# Patient Record
Sex: Female | Born: 1965 | Race: Black or African American | Hispanic: No | Marital: Single | Smoking: Never smoker
Health system: Southern US, Community
[De-identification: ages and names within clinical notes are randomized; demographics above are authoritative.]

## PROBLEM LIST (undated history)

## (undated) DIAGNOSIS — K922 Gastrointestinal hemorrhage, unspecified: Secondary | ICD-10-CM

## (undated) DIAGNOSIS — E7800 Pure hypercholesterolemia, unspecified: Secondary | ICD-10-CM

## (undated) HISTORY — PX: TUBAL LIGATION: SHX77

---

## 1997-12-20 ENCOUNTER — Emergency Department (HOSPITAL_COMMUNITY): Admission: EM | Admit: 1997-12-20 | Discharge: 1997-12-20 | Payer: Self-pay | Admitting: Emergency Medicine

## 1998-04-17 ENCOUNTER — Other Ambulatory Visit: Admission: RE | Admit: 1998-04-17 | Discharge: 1998-04-17 | Payer: Self-pay | Admitting: Obstetrics and Gynecology

## 1999-05-26 ENCOUNTER — Other Ambulatory Visit: Admission: RE | Admit: 1999-05-26 | Discharge: 1999-05-26 | Payer: Self-pay | Admitting: Obstetrics and Gynecology

## 2000-06-23 ENCOUNTER — Other Ambulatory Visit: Admission: RE | Admit: 2000-06-23 | Discharge: 2000-06-23 | Payer: Self-pay | Admitting: Obstetrics and Gynecology

## 2001-07-17 ENCOUNTER — Other Ambulatory Visit: Admission: RE | Admit: 2001-07-17 | Discharge: 2001-07-17 | Payer: Self-pay | Admitting: Obstetrics and Gynecology

## 2001-10-16 ENCOUNTER — Encounter: Admission: RE | Admit: 2001-10-16 | Discharge: 2001-10-16 | Payer: Self-pay | Admitting: Family Medicine

## 2001-10-16 ENCOUNTER — Encounter: Payer: Self-pay | Admitting: Family Medicine

## 2002-07-20 ENCOUNTER — Other Ambulatory Visit: Admission: RE | Admit: 2002-07-20 | Discharge: 2002-07-20 | Payer: Self-pay | Admitting: Obstetrics and Gynecology

## 2003-07-22 ENCOUNTER — Other Ambulatory Visit: Admission: RE | Admit: 2003-07-22 | Discharge: 2003-07-22 | Payer: Self-pay | Admitting: Obstetrics and Gynecology

## 2006-11-04 ENCOUNTER — Encounter: Admission: RE | Admit: 2006-11-04 | Discharge: 2006-11-04 | Payer: Self-pay | Admitting: Obstetrics and Gynecology

## 2007-06-22 ENCOUNTER — Ambulatory Visit (HOSPITAL_COMMUNITY): Admission: RE | Admit: 2007-06-22 | Discharge: 2007-06-22 | Payer: Self-pay | Admitting: Obstetrics and Gynecology

## 2007-07-06 HISTORY — PX: BUNIONECTOMY: SHX129

## 2007-11-13 ENCOUNTER — Encounter: Payer: Self-pay | Admitting: Internal Medicine

## 2007-12-12 ENCOUNTER — Encounter: Admission: RE | Admit: 2007-12-12 | Discharge: 2007-12-12 | Payer: Self-pay | Admitting: Obstetrics and Gynecology

## 2007-12-12 ENCOUNTER — Encounter: Admission: RE | Admit: 2007-12-12 | Discharge: 2007-12-12 | Payer: Self-pay | Admitting: Family Medicine

## 2008-04-07 ENCOUNTER — Emergency Department (HOSPITAL_COMMUNITY): Admission: EM | Admit: 2008-04-07 | Discharge: 2008-04-07 | Payer: Self-pay | Admitting: Emergency Medicine

## 2008-10-03 LAB — CONVERTED CEMR LAB: Pap Smear: NORMAL

## 2008-11-21 ENCOUNTER — Ambulatory Visit: Payer: Self-pay | Admitting: Internal Medicine

## 2008-11-21 DIAGNOSIS — G4700 Insomnia, unspecified: Secondary | ICD-10-CM | POA: Insufficient documentation

## 2008-11-21 DIAGNOSIS — E785 Hyperlipidemia, unspecified: Secondary | ICD-10-CM | POA: Insufficient documentation

## 2008-12-19 ENCOUNTER — Encounter: Admission: RE | Admit: 2008-12-19 | Discharge: 2008-12-19 | Payer: Self-pay | Admitting: Obstetrics and Gynecology

## 2009-09-26 ENCOUNTER — Ambulatory Visit (HOSPITAL_BASED_OUTPATIENT_CLINIC_OR_DEPARTMENT_OTHER): Admission: RE | Admit: 2009-09-26 | Discharge: 2009-09-26 | Payer: Self-pay | Admitting: Specialist

## 2009-12-06 ENCOUNTER — Emergency Department (HOSPITAL_COMMUNITY): Admission: EM | Admit: 2009-12-06 | Discharge: 2009-12-07 | Payer: Self-pay | Admitting: Emergency Medicine

## 2010-01-19 ENCOUNTER — Encounter: Admission: RE | Admit: 2010-01-19 | Discharge: 2010-01-19 | Payer: Self-pay | Admitting: Obstetrics and Gynecology

## 2010-07-26 ENCOUNTER — Encounter: Payer: Self-pay | Admitting: Obstetrics and Gynecology

## 2010-09-21 LAB — COMPREHENSIVE METABOLIC PANEL
ALT: 18 U/L (ref 0–35)
AST: 26 U/L (ref 0–37)
Albumin: 3.6 g/dL (ref 3.5–5.2)
Alkaline Phosphatase: 54 U/L (ref 39–117)
BUN: 9 mg/dL (ref 6–23)
CO2: 26 mEq/L (ref 19–32)
Calcium: 8.8 mg/dL (ref 8.4–10.5)
Chloride: 106 mEq/L (ref 96–112)
Creatinine, Ser: 0.69 mg/dL (ref 0.4–1.2)
GFR calc Af Amer: >60 mL/min (ref >60)
GFR calc non Af Amer: >60 mL/min (ref >60)
Glucose, Bld: 104 mg/dL — ABNORMAL HIGH (ref 70–99)
Potassium: 2.9 mEq/L — ABNORMAL LOW (ref 3.5–5.1)
Sodium: 136 mEq/L (ref 135–145)
Total Bilirubin: 0.4 mg/dL (ref 0.3–1.2)
Total Protein: 6.3 g/dL (ref 6.0–8.3)

## 2010-09-21 LAB — DIFFERENTIAL
Basophils Absolute: 0 10*3/uL (ref 0.0–0.1)
Basophils Relative: 0 % (ref 0–1)
Eosinophils Absolute: 0.1 10*3/uL (ref 0.0–0.7)
Eosinophils Relative: 0 % (ref 0–5)
Lymphocytes Relative: 10 % — ABNORMAL LOW (ref 12–46)
Lymphs Abs: 1.6 10*3/uL (ref 0.7–4.0)
Monocytes Absolute: 0.7 10*3/uL (ref 0.1–1.0)
Monocytes Relative: 4 % (ref 3–12)
Neutro Abs: 13.7 10*3/uL — ABNORMAL HIGH (ref 1.7–7.7)
Neutrophils Relative %: 85 % — ABNORMAL HIGH (ref 43–77)

## 2010-09-21 LAB — PROTIME-INR
INR: 1.02 (ref 0.00–1.49)
Prothrombin Time: 13.3 seconds (ref 11.6–15.2)

## 2010-09-21 LAB — URINALYSIS, ROUTINE W REFLEX MICROSCOPIC
Bilirubin Urine: NEGATIVE
Glucose, UA: NEGATIVE mg/dL
Hgb urine dipstick: NEGATIVE
Ketones, ur: NEGATIVE mg/dL
Nitrite: NEGATIVE
Protein, ur: NEGATIVE mg/dL
Specific Gravity, Urine: 1.02 (ref 1.005–1.030)
Urobilinogen, UA: 0.2 mg/dL (ref 0.0–1.0)
pH: 5.5 (ref 5.0–8.0)

## 2010-09-21 LAB — POCT CARDIAC MARKERS
CKMB, poc: <1 ng/mL — ABNORMAL LOW (ref 1.0–8.0)
CKMB, poc: <1 ng/mL — ABNORMAL LOW (ref 1.0–8.0)
Myoglobin, poc: 31.8 ng/mL (ref 12–200)
Myoglobin, poc: 32.1 ng/mL (ref 12–200)
Troponin i, poc: <0.05 ng/mL (ref 0.00–0.09)
Troponin i, poc: <0.05 ng/mL (ref 0.00–0.09)

## 2010-09-21 LAB — CBC
HCT: 34.4 % — ABNORMAL LOW (ref 36.0–46.0)
Hemoglobin: 11.2 g/dL — ABNORMAL LOW (ref 12.0–15.0)
MCHC: 32.7 g/dL (ref 30.0–36.0)
MCV: 83.4 fL (ref 78.0–100.0)
Platelets: 234 10*3/uL (ref 150–400)
RBC: 4.12 MIL/uL (ref 3.87–5.11)
RDW: 13.4 % (ref 11.5–15.5)
WBC: 16.1 10*3/uL — ABNORMAL HIGH (ref 4.0–10.5)

## 2010-09-21 LAB — LIPASE, BLOOD: Lipase: 32 U/L (ref 11–59)

## 2010-09-21 LAB — POCT PREGNANCY, URINE: Preg Test, Ur: NEGATIVE

## 2010-09-28 LAB — POCT HEMOGLOBIN-HEMACUE: Hemoglobin: 13.8 g/dL (ref 12.0–15.0)

## 2010-09-28 LAB — SYNOVIAL CELL COUNT + DIFF, W/ CRYSTALS
Crystals, Fluid: NONE SEEN
Eosinophils-Synovial: 2 % — ABNORMAL HIGH (ref 0–1)
Lymphocytes-Synovial Fld: 59 % — ABNORMAL HIGH (ref 0–20)
Monocyte-Macrophage-Synovial Fluid: 22 % — ABNORMAL LOW (ref 50–90)
Neutrophil, Synovial: 17 % (ref 0–25)
Other Cells-SYN: 0
WBC, Synovial: UNDETERMINED /mm3 (ref 0–200)

## 2010-11-17 NOTE — Op Note (Signed)
NAMELATORI, BEGGS               ACCOUNT NO.:  [id number]   MEDICAL RECORD NO.:  [id number]          PATIENT TYPE:  AMB   LOCATION:  SDC                           FACILITY:  [facility]   PHYSICIAN:  Lenoard Aden, M.D.DATE OF BIRTH:  1965-12-15   DATE OF PROCEDURE:  06/22/2007  DATE OF DISCHARGE:                               OPERATIVE REPORT   CHIEF COMPLAINT:  Desire for elective sterilization.   PREOPERATIVE DIAGNOSIS:  Desire for elective sterilization.   POSTOPERATIVE DIAGNOSIS:  Desire for elective sterilization.   PROCEDURE:  Diagnostic laparoscopy, laparoscopic tubal ligation.   SURGEON:  Lenoard Aden, M.D.   ANESTHESIA:  General.   ESTIMATED BLOOD LOSS:  Less than 50 mL.   COMPLICATIONS:  None.   DRAINS:  None.   COUNTS:  Correct.   CONDITION:  The patient recovered in good condition.   Brief.   OPERATIVE NOTE:  After being apprised of the risks of anesthesia,  infection, bleeding, injury to abdominal organs, need for repair as well  as complications to include bowel or bladder injury the patient was  brought to the operating room where she was administered general  anesthetic without complications, prepped and draped in usual sterile  fashion.  Hulka tenaculum placed per vagina.  Infraumbilical incision  made with a scalpel.  Veress needle placed.  Opening pressure -1 noted  after negative saline test.  Four liters CO2 insufflated without  difficulty.  Trocar placed atraumatically.  Pictures taken.  Normal  liver, gallbladder bed, normal appendiceal area.  Normal tubes, normal  ovaries.  Uterus has a small approximately 2 cm subserosal posterior  wall fibroid.  Otherwise normal findings.  The right tube was traced out  to the fimbriated end.  The Kleppinger bipolar electrocautery is placed  in the ampullary isthmic portion of the tube, cauterizing to a  resistance of zero, three contiguous portions of the tube.  Same  procedure that was done on the right  tube was also done on the left  tube.  Both tubes were then addressed with the hook scissors and divided  in the midportion of the cauterized area whereby the tubal lumens were  visualized.  CO2 was released.  Good hemostasis was noted.  No active  bleeding is appreciated.  Instruments were removed  under direct visualization.  CO2 was rereleased.  Positive pressure  applied.  Incision closed using zero Vicryl and Dermabond.  Instruments  removed from the vagina.  The patient tolerated the procedure well and  was transferred to surgical recovery in good condition.      Lenoard Aden, M.D.  Electronically Signed     RJT/MEDQ  D:  06/22/2007  T:  06/23/2007  Job:  [id number]

## 2011-01-18 ENCOUNTER — Other Ambulatory Visit: Payer: Self-pay | Admitting: Obstetrics and Gynecology

## 2011-01-18 DIAGNOSIS — Z1231 Encounter for screening mammogram for malignant neoplasm of breast: Secondary | ICD-10-CM

## 2011-01-29 ENCOUNTER — Ambulatory Visit
Admission: RE | Admit: 2011-01-29 | Discharge: 2011-01-29 | Disposition: A | Discharge status: Home / Self Care | Payer: BC Managed Care – PPO | Source: Ambulatory Visit | Attending: Obstetrics and Gynecology | Admitting: Obstetrics and Gynecology

## 2011-01-29 DIAGNOSIS — Z1231 Encounter for screening mammogram for malignant neoplasm of breast: Secondary | ICD-10-CM

## 2011-04-09 LAB — CBC
HCT: 39.2
Hemoglobin: 13.1
MCHC: 33.3
MCV: 83.3
Platelets: 294
RBC: 4.7
RDW: 13.7
WBC: 8.7

## 2011-04-09 LAB — HCG, SERUM, QUALITATIVE: Preg, Serum: NEGATIVE

## 2011-07-14 ENCOUNTER — Ambulatory Visit: Payer: No Typology Code available for payment source | Attending: Orthopedic Surgery | Admitting: Physical Therapy

## 2011-07-14 DIAGNOSIS — M542 Cervicalgia: Secondary | ICD-10-CM | POA: Insufficient documentation

## 2011-07-14 DIAGNOSIS — IMO0001 Reserved for inherently not codable concepts without codable children: Secondary | ICD-10-CM | POA: Insufficient documentation

## 2011-07-15 ENCOUNTER — Ambulatory Visit: Payer: No Typology Code available for payment source | Admitting: Physical Therapy

## 2011-07-19 ENCOUNTER — Ambulatory Visit: Payer: No Typology Code available for payment source | Admitting: Physical Therapy

## 2011-07-20 ENCOUNTER — Ambulatory Visit: Payer: No Typology Code available for payment source | Admitting: Physical Therapy

## 2011-07-28 ENCOUNTER — Ambulatory Visit: Payer: No Typology Code available for payment source | Admitting: Physical Therapy

## 2011-07-29 ENCOUNTER — Ambulatory Visit: Payer: No Typology Code available for payment source | Admitting: Physical Therapy

## 2011-08-04 ENCOUNTER — Ambulatory Visit: Payer: No Typology Code available for payment source | Admitting: Physical Therapy

## 2011-08-05 ENCOUNTER — Ambulatory Visit: Payer: No Typology Code available for payment source | Admitting: Physical Therapy

## 2011-08-06 ENCOUNTER — Ambulatory Visit: Payer: No Typology Code available for payment source | Attending: Orthopedic Surgery | Admitting: Physical Therapy

## 2011-08-06 DIAGNOSIS — IMO0001 Reserved for inherently not codable concepts without codable children: Secondary | ICD-10-CM | POA: Insufficient documentation

## 2011-08-06 DIAGNOSIS — M542 Cervicalgia: Secondary | ICD-10-CM | POA: Insufficient documentation

## 2011-08-09 ENCOUNTER — Ambulatory Visit: Payer: No Typology Code available for payment source | Admitting: Physical Therapy

## 2011-08-12 ENCOUNTER — Ambulatory Visit: Payer: No Typology Code available for payment source | Admitting: Physical Therapy

## 2011-08-17 ENCOUNTER — Ambulatory Visit: Payer: No Typology Code available for payment source | Admitting: Physical Therapy

## 2011-08-19 ENCOUNTER — Ambulatory Visit: Payer: No Typology Code available for payment source | Admitting: Physical Therapy

## 2012-02-10 ENCOUNTER — Other Ambulatory Visit: Payer: Self-pay | Admitting: Obstetrics and Gynecology

## 2012-02-10 DIAGNOSIS — Z1231 Encounter for screening mammogram for malignant neoplasm of breast: Secondary | ICD-10-CM

## 2012-02-21 ENCOUNTER — Ambulatory Visit: Payer: BC Managed Care – PPO

## 2012-02-23 ENCOUNTER — Ambulatory Visit
Admission: RE | Admit: 2012-02-23 | Discharge: 2012-02-23 | Disposition: A | Discharge status: Home / Self Care | Payer: 59 | Source: Ambulatory Visit | Attending: Obstetrics and Gynecology | Admitting: Obstetrics and Gynecology

## 2012-02-23 DIAGNOSIS — Z1231 Encounter for screening mammogram for malignant neoplasm of breast: Secondary | ICD-10-CM

## 2012-07-11 ENCOUNTER — Encounter (HOSPITAL_COMMUNITY): Payer: Self-pay | Admitting: *Deleted

## 2012-07-11 ENCOUNTER — Emergency Department (INDEPENDENT_AMBULATORY_CARE_PROVIDER_SITE_OTHER)
Admission: EM | Admit: 2012-07-11 | Discharge: 2012-07-11 | Disposition: A | Discharge status: Home / Self Care | Payer: Worker's Compensation | Source: Home / Self Care

## 2012-07-11 DIAGNOSIS — IMO0002 Reserved for concepts with insufficient information to code with codable children: Secondary | ICD-10-CM

## 2012-07-11 DIAGNOSIS — S9030XA Contusion of unspecified foot, initial encounter: Secondary | ICD-10-CM

## 2012-07-11 DIAGNOSIS — T148XXA Other injury of unspecified body region, initial encounter: Secondary | ICD-10-CM

## 2012-07-11 NOTE — ED Notes (Signed)
Was going through a turnstyle when she was returning to work @ 2130 and cut L heel.  She said it was swollen last night and applied ice.  1" laceration to heel.

## 2012-07-11 NOTE — ED Notes (Signed)
Paper that was faxed from her work did not need to be filled in.  [name] said , " She will not have to have further treatment."

## 2012-07-11 NOTE — ED Notes (Addendum)
No ace wrap was actually used.  Cathy Rasmussen NP wants a coban dressing applied like an ace for support without being to thick like an ace. Pt. Requested work note.  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 Pt. said she can work if she can get a shoe on.  She does not have to go in until 1700.  Work note done to return tomorrow.

## 2012-07-11 NOTE — ED Provider Notes (Signed)
History     CSN: [id number]  Arrival date & time 07/11/12  1818   None     Chief Complaint  Patient presents with  . Extremity Laceration    (Consider location/radiation/quality/duration/timing/severity/associated sxs/prior treatment) HPI Comments: 48 year old female was walking through a turnstile last night and one of the metal bars struck her in the left posterior heel. She received a superficial linear abrasion as well is contusion of the Achilles tendon. She is complaining of mild pain over the tendon especially with walking and plantar flexion. She has been applying ice and wearing an Ace-type wrap.   No past medical history on file.  No past surgical history on file.  No family history on file.  History  Substance Use Topics  . Smoking status: Not on file  . Smokeless tobacco: Not on file  . Alcohol Use: Not on file    OB History    No data available      Review of Systems  Constitutional: Negative for fever, chills and activity change.  HENT: Negative.   Respiratory: Negative.   Cardiovascular: Negative.   Musculoskeletal:       As per HPI  Skin: Negative for color change, pallor and rash.  Neurological: Negative.     Allergies  Codeine  Home Medications   Current Outpatient Rx  Name  Route  Sig  Dispense  Refill  . ACETAMINOPHEN 500 MG PO TABS   Oral   Take 1,000 mg by mouth every 6 (six) hours as needed.         Marland Kitchen PRAVASTATIN SODIUM 40 MG PO TABS   Oral   Take 40 mg by mouth daily.           BP 115/63  Pulse 62  Temp 98.3 F (36.8 C) (Oral)  Resp 16  SpO2 100%  LMP 07/11/2012  Physical Exam  Nursing note and vitals reviewed. Constitutional: She is oriented to person, place, and time. She appears well-developed and well-nourished. No distress.  HENT:  Head: Normocephalic and atraumatic.  Eyes: EOM are normal. Pupils are equal, round, and reactive to light.  Neck: Normal range of motion. Neck supple.  Musculoskeletal:   There is tenderness over the distal most aspect of the Achilles tendon. There is no swelling or discoloration. No bony deformity. There is a superficial linear abrasion over the same area. No signs of infection. Full range of motion with dorsiflexion and plantar flexion. Distal neurovascular motor sensory is intact.  Lymphadenopathy:    She has no cervical adenopathy.  Neurological: She is alert and oriented to person, place, and time. No cranial nerve deficit.  Skin: Skin is warm and dry.  Psychiatric: She has a normal mood and affect.    ED Course  Procedures (including critical care time)  Labs Reviewed - No data to display No results found.   1. Contusion of heel   2. Abrasion       MDM  We will apply a thin wrap with Coban and 2 her heel to support the Achilles tendon and limit dorsiflexion. Ice over the area of discomfort for the next 48 hours. She is using crutches and she may continue to use so if needed. She may bear weight as tolerated Clean wound with Betadine and then apply a new Band-Aid. Other problems or worsening may return.         Hayden Rasmussen, NP 07/11/12 1942

## 2012-07-12 NOTE — ED Provider Notes (Signed)
Medical screening examination/treatment/procedure(s) were performed by resident physician or non-physician practitioner and as supervising physician I was immediately available for consultation/collaboration.   Barkley Bruns MD.    Linna Hoff, MD 07/12/12 2056

## 2014-05-02 ENCOUNTER — Other Ambulatory Visit: Payer: Self-pay

## 2014-05-02 DIAGNOSIS — Z1231 Encounter for screening mammogram for malignant neoplasm of breast: Secondary | ICD-10-CM

## 2014-05-09 ENCOUNTER — Other Ambulatory Visit (HOSPITAL_COMMUNITY)
Admission: RE | Admit: 2014-05-09 | Discharge: 2014-05-09 | Disposition: A | Discharge status: Home / Self Care | Payer: 59 | Source: Ambulatory Visit | Attending: Nurse Practitioner | Admitting: Nurse Practitioner

## 2014-05-09 ENCOUNTER — Other Ambulatory Visit: Payer: Self-pay | Admitting: Nurse Practitioner

## 2014-05-09 DIAGNOSIS — Z1151 Encounter for screening for human papillomavirus (HPV): Secondary | ICD-10-CM | POA: Diagnosis present

## 2014-05-09 DIAGNOSIS — Z113 Encounter for screening for infections with a predominantly sexual mode of transmission: Secondary | ICD-10-CM | POA: Diagnosis present

## 2014-05-09 DIAGNOSIS — Z01419 Encounter for gynecological examination (general) (routine) without abnormal findings: Secondary | ICD-10-CM | POA: Diagnosis not present

## 2014-05-10 LAB — CYTOLOGY - PAP

## 2014-05-20 ENCOUNTER — Ambulatory Visit
Admission: RE | Admit: 2014-05-20 | Discharge: 2014-05-20 | Disposition: A | Discharge status: Home / Self Care | Payer: No Typology Code available for payment source | Source: Ambulatory Visit

## 2014-05-20 ENCOUNTER — Encounter (INDEPENDENT_AMBULATORY_CARE_PROVIDER_SITE_OTHER): Payer: Self-pay

## 2014-05-20 DIAGNOSIS — Z1231 Encounter for screening mammogram for malignant neoplasm of breast: Secondary | ICD-10-CM

## 2014-06-11 ENCOUNTER — Other Ambulatory Visit: Payer: Self-pay | Admitting: Nurse Practitioner

## 2014-06-11 DIAGNOSIS — E229 Hyperfunction of pituitary gland, unspecified: Principal | ICD-10-CM

## 2014-06-11 DIAGNOSIS — R7989 Other specified abnormal findings of blood chemistry: Secondary | ICD-10-CM

## 2014-06-13 ENCOUNTER — Ambulatory Visit
Admission: RE | Admit: 2014-06-13 | Discharge: 2014-06-13 | Disposition: A | Discharge status: Home / Self Care | Payer: 59 | Source: Ambulatory Visit | Attending: Nurse Practitioner | Admitting: Nurse Practitioner

## 2014-06-13 DIAGNOSIS — R7989 Other specified abnormal findings of blood chemistry: Secondary | ICD-10-CM

## 2014-06-13 DIAGNOSIS — E229 Hyperfunction of pituitary gland, unspecified: Principal | ICD-10-CM

## 2014-06-13 MED ORDER — GADOBENATE DIMEGLUMINE 529 MG/ML IV SOLN
10.0000 mL | Freq: Once | INTRAVENOUS | Status: AC | PRN
  Administered 2014-06-13: 10 mL via INTRAVENOUS

## 2014-07-11 ENCOUNTER — Ambulatory Visit (INDEPENDENT_AMBULATORY_CARE_PROVIDER_SITE_OTHER): Payer: 59 | Admitting: Endocrinology

## 2014-07-11 ENCOUNTER — Encounter: Payer: Self-pay | Admitting: Endocrinology

## 2014-07-11 VITALS — BP 124/78 | HR 66 | Temp 98.0°F | Resp 14 | Ht 64.0 in | Wt 143.4 lb

## 2014-07-11 DIAGNOSIS — R635 Abnormal weight gain: Secondary | ICD-10-CM

## 2014-07-11 DIAGNOSIS — E221 Hyperprolactinemia: Secondary | ICD-10-CM

## 2014-07-11 NOTE — Progress Notes (Signed)
Patient ID: Cathy Santana, female   DOB: 03/30/1966, 49 y.o.   MRN: [id number]   Chief complaint: High prolactin level  History of Present Illness:  Problem 1: High prolactin She apparently had amenorrhea for about a year at the time of initial diagnosis of high prolactin She is not sure when this was but may have been in 2008, at least 5 years ago She did not have any associated milky discharge from her breasts. Also apparently she had switched from a patch birth control pill to an oral contraceptive around that time She thinks her prolactin level was significantly high, about 100 and was checked by her gynecologist He put her on bromocriptine and with this she had restoration of her menstrual cycles  She took bromocriptine about 6-12 months and her medication was then stopped. She thinks that her menstrual cycles stayed normal after stopping the medication and she was told her prolactin level was not significantly high. About 4 years ago even though she was having regular menstrual cycles her prolactin level was apparently close to 100 again She was supposed to get an MRI of her pituitary gland but because of lack of insurance she did not do so and did not follow-up  She has been having regular menstrual cycles and no breast discharge since then She does have mild headaches off and on which is not new.  No visual difficulties and has had recent diabetes exam However her gynecologist checked her prolactin levels in 11/15 and this was slightly high at 27 Repeat fasting prolactin was 30 Her MRI showed no pituitary disease  Problem 2:  Excessive bruising.  She thinks she has for the last 2 years been bruising easily and with any minor trauma will have persistent bruise for up to 4 weeks.  She has had a gradual weight gain over the last 2 years.  Does not exercise She previously had significant problems with late insomnia which was causing fatigue Currently is having no insomnia or  fatigue She does not think she has any muscle weakness or depression. She has had some acne on and off and mild hair loss    No past medical history on file.  Past Surgical History  Procedure Laterality Date  . Cesarean section      '96  . Tubal ligation      '08  . Bunionectomy  2009    bilateral 4 weeks apart    Family History  Problem Relation Age of Onset  . Stroke Father     Social History:  reports that she has never smoked. She does not have any smokeless tobacco history on file. She reports that she does not drink alcohol or use illicit drugs.  Allergies:  Allergies  Allergen Reactions  . Codeine Nausea And Vomiting      Medication List       This list is accurate as of: 07/11/14 11:38 AM.  Always use your most recent med list.               acetaminophen 500 MG tablet  Commonly known as:  TYLENOL  Take 1,000 mg by mouth every 6 (six) hours as needed.     multivitamin with minerals Tabs tablet  Take 1 tablet by mouth daily.     pravastatin 40 MG tablet  Commonly known as:  PRAVACHOL  Take 40 mg by mouth daily.        LABS:  No visits with results within 1 Week(s) from this visit.  Latest known visit with results is:  Orders Only on 05/09/2014  Component Date Value Ref Range Status  . CYTOLOGY - PAP 05/09/2014 PAP RESULT   Final     REVIEW OF SYSTEMS:             Skin: No rash      Thyroid:  No cold or heat intolerance, unusual fatigue.     His blood pressure has been normal.     No swelling of feet.    She is taking pravastatin from her PCP and was told that she needed to control her hypercholesterolemia which was about 200 at baseline      All other review of systems negative   PHYSICAL EXAM:  BP 124/78 mmHg  Pulse 66  Temp(Src) 98 F (36.7 C)  Resp 14  Ht  (1.626 m)  Wt 143 lb 6.4 oz (65.046 kg)  BMI 24.60 kg/m2  SpO2 99%  GENERAL: Averagely built and nourished.  No central obesity present on her neck Facial  appearance is normal  No pallor, clubbing, lymphadenopathy or edema.   Skin:  no rash or pigmentation.  No alopecia.  No hirsutism or acne.  No thinning of the skin on her forearm. Has no purplish striae  EYES:  Externally normal.  Fundii:  normal discs and vessels.  ENT: Oral mucosa and tongue normal.  THYROID:  Not palpable.  HEART:  Normal  S1 and S2; no murmur or click.  CHEST:  Normal shape.  Lungs: Vescicular breath sounds heard equally.  No crepitations/ wheeze.  ABDOMEN:  No distention.  Liver and spleen not palpable.  No other mass or tenderness.  NEUROLOGICAL: .Reflexes are normal bilaterally at ankles.  JOINTS:  Normal.  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 Currently this appears to be minimally active as she has no oligomenorrhea and has only minimal increase in prolactin level Also does not have any microadenoma seen on her MRI  Weight gain and skin bruising: Etiology unclear. She does not have any clinical features of Cushing's disease by history or exam   PLAN:   She does not need treatment for her mild hyperprolactinemia as this is asymptomatic. She appears to be in remission from her original problem of amenorrhea related to significantly high prolactin level by history She will call if she has any missed menstrual cycles for reevaluation otherwise does not need any follow-up  She will follow-up with her PCP for evaluation of excessive bruising  Charlsey Moragne 07/11/2014, 11:38 AM

## 2015-04-30 ENCOUNTER — Other Ambulatory Visit: Payer: Self-pay

## 2015-04-30 DIAGNOSIS — Z1231 Encounter for screening mammogram for malignant neoplasm of breast: Secondary | ICD-10-CM

## 2015-05-22 ENCOUNTER — Ambulatory Visit
Admission: RE | Admit: 2015-05-22 | Discharge: 2015-05-22 | Disposition: A | Discharge status: Home / Self Care | Payer: No Typology Code available for payment source | Source: Ambulatory Visit

## 2015-05-22 DIAGNOSIS — Z1231 Encounter for screening mammogram for malignant neoplasm of breast: Secondary | ICD-10-CM

## 2016-02-06 ENCOUNTER — Other Ambulatory Visit: Payer: Self-pay | Admitting: Gastroenterology

## 2016-04-29 ENCOUNTER — Other Ambulatory Visit: Payer: Self-pay | Admitting: Family Medicine

## 2016-04-29 ENCOUNTER — Other Ambulatory Visit: Payer: Self-pay | Admitting: Nurse Practitioner

## 2016-04-29 DIAGNOSIS — Z1231 Encounter for screening mammogram for malignant neoplasm of breast: Secondary | ICD-10-CM

## 2016-05-24 ENCOUNTER — Ambulatory Visit
Admission: RE | Admit: 2016-05-24 | Discharge: 2016-05-24 | Disposition: A | Discharge status: Home / Self Care | Payer: BLUE CROSS/BLUE SHIELD | Source: Ambulatory Visit | Attending: Nurse Practitioner | Admitting: Nurse Practitioner

## 2016-05-24 DIAGNOSIS — Z1231 Encounter for screening mammogram for malignant neoplasm of breast: Secondary | ICD-10-CM

## 2017-05-30 ENCOUNTER — Other Ambulatory Visit: Payer: Self-pay | Admitting: Obstetrics & Gynecology

## 2017-05-30 ENCOUNTER — Other Ambulatory Visit: Payer: Self-pay | Admitting: Nurse Practitioner

## 2017-05-30 DIAGNOSIS — Z1231 Encounter for screening mammogram for malignant neoplasm of breast: Secondary | ICD-10-CM

## 2017-06-23 ENCOUNTER — Ambulatory Visit
Admission: RE | Admit: 2017-06-23 | Discharge: 2017-06-23 | Disposition: A | Discharge status: Home / Self Care | Payer: BLUE CROSS/BLUE SHIELD | Source: Ambulatory Visit | Attending: Obstetrics & Gynecology | Admitting: Obstetrics & Gynecology

## 2017-06-23 ENCOUNTER — Ambulatory Visit: Payer: Self-pay

## 2017-06-23 DIAGNOSIS — Z1231 Encounter for screening mammogram for malignant neoplasm of breast: Secondary | ICD-10-CM

## 2018-01-15 ENCOUNTER — Ambulatory Visit (HOSPITAL_COMMUNITY)
Admission: EM | Admit: 2018-01-15 | Discharge: 2018-01-15 | Disposition: A | Discharge status: Home / Self Care | Payer: BLUE CROSS/BLUE SHIELD | Attending: Internal Medicine | Admitting: Internal Medicine

## 2018-01-15 ENCOUNTER — Ambulatory Visit (INDEPENDENT_AMBULATORY_CARE_PROVIDER_SITE_OTHER): Payer: Self-pay

## 2018-01-15 ENCOUNTER — Other Ambulatory Visit: Payer: Self-pay

## 2018-01-15 ENCOUNTER — Encounter (HOSPITAL_COMMUNITY): Payer: Self-pay | Admitting: Emergency Medicine

## 2018-01-15 DIAGNOSIS — S63502A Unspecified sprain of left wrist, initial encounter: Secondary | ICD-10-CM

## 2018-01-15 DIAGNOSIS — M25532 Pain in left wrist: Secondary | ICD-10-CM | POA: Diagnosis not present

## 2018-01-15 MED ORDER — ESOMEPRAZOLE MAGNESIUM 20 MG PO PACK: 20.0000 mg | PACK | Freq: Two times a day (BID) | ORAL | 0 refills | Status: DC

## 2018-01-15 MED ORDER — NAPROXEN 375 MG PO TABS: 375.0000 mg | ORAL_TABLET | Freq: Two times a day (BID) | ORAL | 0 refills | Status: DC

## 2018-01-15 NOTE — ED Triage Notes (Signed)
Left wrist injury that happened on Friday.  States pain in left wrist and pain going up left arm.

## 2018-01-15 NOTE — ED Provider Notes (Signed)
[facility]    CSN: [id number] Arrival date & time: 01/15/18  1358     History   Chief Complaint Chief Complaint  Patient presents with  . Wrist Injury    HPI Cathy Santana is a 52 y.o. female no contributing past medical history presenting today for evaluation of left wrist injury.  Patient states that the police put handcuffs on her on Friday and since she has had pain and tenderness throughout her left wrist.  Also noting to have pain that radiates up into her arm.  Having difficulty writing and gripping.  Patient is left-handed.  Is having some slight numbness and tingling into her fingers.  HPI  History reviewed. No pertinent past medical history.  Patient Active Problem List   Diagnosis Date Noted  . HYPERLIPIDEMIA 11/21/2008  . INSOMNIA, CHRONIC 11/21/2008    Past Surgical History:  Procedure Laterality Date  . BUNIONECTOMY  2009   bilateral 4 weeks apart  . CESAREAN SECTION     '96  . TUBAL LIGATION     '08    OB History   None      Home Medications    Prior to Admission medications   Medication Sig Start Date End Date Taking? Authorizing Provider  acetaminophen (TYLENOL) 500 MG tablet Take 1,000 mg by mouth every 6 (six) hours as needed.    [provider]  esomeprazole (NEXIUM) 20 MG packet Take 20 mg by mouth 2 (two) times daily. 01/15/18   Wieters, Hallie C, PA-C  Multiple Vitamin (MULTIVITAMIN WITH MINERALS) TABS tablet Take 1 tablet by mouth daily.    [provider]  naproxen (NAPROSYN) 375 MG tablet Take 1 tablet (375 mg total) by mouth 2 (two) times daily. 01/15/18   Wieters, Hallie C, PA-C  pravastatin (PRAVACHOL) 40 MG tablet Take 40 mg by mouth daily.    [provider]    Family History Family History  Problem Relation Age of Onset  . Stroke Father   . Stroke Sister   . Breast cancer Paternal Grandmother        unsure of age    Social History Social History   Tobacco Use  . Smoking status:  Never Smoker  Substance Use Topics  . Alcohol use: No  . Drug use: No     Allergies   Codeine   Review of Systems Review of Systems  Constitutional: Negative for fatigue and fever.  Respiratory: Negative for shortness of breath.   Cardiovascular: Negative for chest pain.  Gastrointestinal: Negative for nausea and vomiting.  Musculoskeletal: Positive for arthralgias and myalgias. Negative for back pain, gait problem, joint swelling, neck pain and neck stiffness.  Skin: Negative for color change, pallor and wound.  Neurological: Positive for weakness and numbness. Negative for dizziness, light-headedness and headaches.     Physical Exam Triage Vital Signs ED Triage Vitals  Enc Vitals Group     BP 01/15/18 1455 118/70     Pulse Rate 01/15/18 1455 81     Resp 01/15/18 1455 16     Temp 01/15/18 1455 98.2 F (36.8 C)     Temp Source 01/15/18 1455 Oral     SpO2 01/15/18 1455 99 %     Weight --      Height --      Head Circumference --      Peak Flow --      Pain Score 01/15/18 1500 8     Pain Loc --  Pain Edu? --      Excl. in GC? --    No data found.  Updated Vital Signs BP 118/70 (BP Location: Left Arm)   Pulse 81   Temp 98.2 F (36.8 C) (Oral)   Resp 16   SpO2 99%   Visual Acuity Right Eye Distance:   Left Eye Distance:   Bilateral Distance:    Right Eye Near:   Left Eye Near:    Bilateral Near:     Physical Exam  Constitutional: She is oriented to person, place, and time. She appears well-developed and well-nourished.  No acute distress  HENT:  Head: Normocephalic and atraumatic.  Nose: Nose normal.  Eyes: Conjunctivae are normal.  Neck: Neck supple.  Cardiovascular: Normal rate.  Pulmonary/Chest: Effort normal. No respiratory distress.  Abdominal: She exhibits no distension.  Musculoskeletal: Normal range of motion.  Tenderness to palpation of distal left wrist, and between radius and ulna on both anterior and posterior aspect.  Nontender to  palpation over second through fifth metacarpal, mild tenderness over first metacarpal.  Decreased grip strength in left hand compared to right, no obvious swelling, erythema.  Full active range of motion at elbow and shoulder.  Neurological: She is alert and oriented to person, place, and time.  Skin: Skin is warm and dry.  Psychiatric: She has a normal mood and affect.  Nursing note and vitals reviewed.    UC Treatments / Results  Labs (all labs ordered are listed, but only abnormal results are displayed) Labs Reviewed - No data to display  EKG None  Radiology Dg Wrist Complete Left  Result Date: 01/15/2018 CLINICAL DATA:  Altercation 2 days ago with persistent pain. EXAM: LEFT WRIST - COMPLETE 3+ VIEW COMPARISON:  None. FINDINGS: There is no evidence of fracture or dislocation. There is no evidence of arthropathy or other focal bone abnormality. Soft tissues are unremarkable. IMPRESSION: Normal Electronically Signed   By: Paulina Fusi M.D.   On: 01/15/2018 15:32    Procedures Procedures (including critical care time)  Medications Ordered in UC Medications - No data to display  Initial Impression / Assessment and Plan / UC Course  I have reviewed the triage vital signs and the nursing notes.  Pertinent labs & imaging results that were available during my care of the patient were reviewed by me and considered in my medical decision making (see chart for details).     No fracture on x-ray.  Likely wrist sprain versus contusion from handcuffs.  Will provide wrist brace.  Recommending to take anti-inflammatories, Tylenol, provided Naprosyn with esomeprazole given patient has GI bleeds with NSAIDs.  States that she can tolerate the Naprosyn and use omeprazole.Discussed strict return precautions. Patient verbalized understanding and is agreeable with plan.  Final Clinical Impressions(s) / UC Diagnoses   Final diagnoses:  Sprain of left wrist, initial encounter     Discharge  Instructions     No fracture, likely wrist sprain versus contusion/bruising  Please take the Naprosyn with the Nexium Ice your wrist multiple times a day Please wear wrist brace for support as needed  Follow-up if symptoms not improving or worsening.    ED Prescriptions    Medication Sig Dispense Auth. Provider   naproxen (NAPROSYN) 375 MG tablet Take 1 tablet (375 mg total) by mouth 2 (two) times daily. 20 tablet Wieters, Hallie C, PA-C   esomeprazole (NEXIUM) 20 MG packet Take 20 mg by mouth 2 (two) times daily. 30 each Wieters, [place] C, PA-C  Controlled Substance Prescriptions [place] Controlled Substance Registry consulted? Not Applicable   Lew Dawes, [place] 01/15/18 1552

## 2018-01-15 NOTE — Discharge Instructions (Signed)
No fracture, likely wrist sprain versus contusion/bruising  Please take the Naprosyn with the Nexium Ice your wrist multiple times a day Please wear wrist brace for support as needed  Follow-up if symptoms not improving or worsening.

## 2018-01-21 ENCOUNTER — Encounter (HOSPITAL_COMMUNITY): Payer: Self-pay | Admitting: *Deleted

## 2018-01-21 ENCOUNTER — Ambulatory Visit (HOSPITAL_COMMUNITY)
Admission: EM | Admit: 2018-01-21 | Discharge: 2018-01-21 | Disposition: A | Discharge status: Home / Self Care | Payer: BLUE CROSS/BLUE SHIELD | Attending: Family Medicine | Admitting: Family Medicine

## 2018-01-21 DIAGNOSIS — K047 Periapical abscess without sinus: Secondary | ICD-10-CM

## 2018-01-21 HISTORY — DX: Gastrointestinal hemorrhage, unspecified: K92.2

## 2018-01-21 HISTORY — DX: Pure hypercholesterolemia, unspecified: E78.00

## 2018-01-21 MED ORDER — AMOXICILLIN-POT CLAVULANATE 875-125 MG PO TABS: 1.0000 | ORAL_TABLET | Freq: Two times a day (BID) | ORAL | 0 refills | Status: AC

## 2018-01-21 NOTE — ED Provider Notes (Addendum)
[facility]   [id number] 01/21/18 Arrival Time: 1002  SUBJECTIVE:  Cathy Santana is a 52 y.o. female who reports right upper dental pain described as faint throbbing and neck pain that began 2 days ago. Recently finished course of antibiotics for dental abscess.  Scheduled to have tooth extraction this upcoming week.  Tolerating PO intake, minimal pain with chewing. Normal swallowing. She does see a dentist regularly. Complains of associated nausea.  Denies fever, chills, neck swelling, vomiting, abdominal pain, chest pain, SOB, change in bowel or bladder habits.    ROS: As per HPI.  Past Medical History:  Diagnosis Date  . GI bleed   . Hypercholesteremia    Past Surgical History:  Procedure Laterality Date  . BUNIONECTOMY  2009   bilateral 4 weeks apart  . CESAREAN SECTION     '96  . TUBAL LIGATION     '08   Allergies  Allergen Reactions  . Codeine Nausea And Vomiting   No current facility-administered medications on file prior to encounter.    Current Outpatient Medications on File Prior to Encounter  Medication Sig Dispense Refill  . pravastatin (PRAVACHOL) 40 MG tablet Take 40 mg by mouth daily.     Social History   Socioeconomic History  . Marital status: Single    Spouse name: Not on file  . Number of children: Not on file  . Years of education: Not on file  . Highest education level: Not on file  Occupational History  . Not on file  Social Needs  . Financial resource strain: Not on file  . Food insecurity:    Worry: Not on file    Inability: Not on file  . Transportation needs:    Medical: Not on file    Non-medical: Not on file  Tobacco Use  . Smoking status: Never Smoker  . Smokeless tobacco: Never Used  Substance and Sexual Activity  . Alcohol use: No  . Drug use: No  . Sexual activity: Not on file  Lifestyle  . Physical activity:    Days per week: Not on file    Minutes per session: Not on file  . Stress: Not on file    Relationships  . Social connections:    Talks on phone: Not on file    Gets together: Not on file    Attends religious service: Not on file    Active member of club or organization: Not on file    Attends meetings of clubs or organizations: Not on file    Relationship status: Not on file  . Intimate partner violence:    Fear of current or ex partner: Not on file    Emotionally abused: Not on file    Physically abused: Not on file    Forced sexual activity: Not on file  Other Topics Concern  . Not on file  Social History Narrative  . Not on file   Family History  Problem Relation Age of Onset  . Stroke Father   . Stroke Sister   . Breast cancer Paternal Grandmother        unsure of age    OBJECTIVE:  Vitals:   01/21/18 1018  BP: 127/77  Pulse: 75  Resp: 16  Temp: 98.2 F (36.8 C)  TempSrc: Oral  SpO2: 99%    General appearance: alert; no distress; nontoxic appearance  HENT: normocephalic; atraumatic; dentition: multiple carries; abscess present- right upper over right upper gums without areas of fluctuance Neck: mild left  sided anterior cervical LAD CV: RRR without murmur, gallops or rubs Lungs: normal respirations Skin: warm and dry Psychological: alert and cooperative; normal mood and affect  ASSESSMENT & PLAN:  1. Dental abscess    Given strict return and ER precautions if she does not experience improvement in symptoms within the next day or two.  Patient aware and in agreement with this plan.    Meds ordered this encounter  Medications  . amoxicillin-clavulanate (AUGMENTIN) 875-125 MG tablet    Sig: Take 1 tablet by mouth every 12 (twelve) hours for 10 days.    Dispense:  20 tablet    Refill:  0    Order Specific Question:   Supervising Provider    Answer:   Isa RankinMURRAY, LAURA WILSON [[id number]]   Use OTC medications as needed for pain relief Recommend soft diet until evaluated by dentist Maintain oral hygiene care Follow up with dentist as soon as  possible for further evaluation and treatment  Augmentin prescribed.  Take as directed and to completion Return or go to the ER if you have any new or worsening symptoms  Reviewed expectations re: course of current medical issues. Questions answered. Outlined signs and symptoms indicating need for more acute intervention. Patient verbalized understanding. After Visit Summary given.      Rennis HardingWurst, Cathy Civello, PA-C 01/21/18 1103

## 2018-01-21 NOTE — Discharge Instructions (Addendum)
Use OTC medications as needed for pain relief Recommend soft diet until evaluated by dentist Maintain oral hygiene care Follow up with dentist as soon as possible for further evaluation and treatment  Augmentin prescribed.  Take as directed and to completion Return or go to the ER if you have any new or worsening symptoms

## 2018-01-21 NOTE — ED Triage Notes (Signed)
Reports starting with faint right upper toothache and neck pain 2 days ago.  States had dental abscess and was treated with abx, and was scheduled to have tooth extraction this past week, which has been rescheduled for this upcoming week.

## 2018-04-28 ENCOUNTER — Other Ambulatory Visit: Payer: Self-pay | Admitting: Obstetrics & Gynecology

## 2018-04-28 DIAGNOSIS — Z1231 Encounter for screening mammogram for malignant neoplasm of breast: Secondary | ICD-10-CM

## 2018-06-26 ENCOUNTER — Ambulatory Visit
Admission: RE | Admit: 2018-06-26 | Discharge: 2018-06-26 | Disposition: A | Discharge status: Home / Self Care | Payer: BLUE CROSS/BLUE SHIELD | Source: Ambulatory Visit | Attending: Obstetrics & Gynecology | Admitting: Obstetrics & Gynecology

## 2018-06-26 ENCOUNTER — Other Ambulatory Visit: Payer: Self-pay | Admitting: Obstetrics & Gynecology

## 2018-06-26 DIAGNOSIS — Z1231 Encounter for screening mammogram for malignant neoplasm of breast: Secondary | ICD-10-CM

## 2018-11-26 ENCOUNTER — Encounter (HOSPITAL_COMMUNITY): Payer: Self-pay

## 2018-11-26 ENCOUNTER — Emergency Department (HOSPITAL_COMMUNITY)
Admission: EM | Admit: 2018-11-26 | Discharge: 2018-11-26 | Discharge status: Against Medical Advice | Payer: BLUE CROSS/BLUE SHIELD | Source: Home / Self Care

## 2018-11-26 ENCOUNTER — Emergency Department (HOSPITAL_COMMUNITY)
Admission: EM | Admit: 2018-11-26 | Discharge: 2018-11-26 | Discharge status: Against Medical Advice | Payer: BLUE CROSS/BLUE SHIELD | Attending: Emergency Medicine | Admitting: Emergency Medicine

## 2018-11-26 DIAGNOSIS — Z79899 Other long term (current) drug therapy: Secondary | ICD-10-CM | POA: Diagnosis not present

## 2018-11-26 DIAGNOSIS — Z5320 Procedure and treatment not carried out because of patient's decision for unspecified reasons: Secondary | ICD-10-CM | POA: Diagnosis not present

## 2018-11-26 DIAGNOSIS — M79646 Pain in unspecified finger(s): Secondary | ICD-10-CM

## 2018-11-26 DIAGNOSIS — M79641 Pain in right hand: Secondary | ICD-10-CM | POA: Insufficient documentation

## 2018-11-26 NOTE — ED Triage Notes (Signed)
Pt has a ring stuck on her R ring finger for the past 24 hours, swelling noted

## 2018-11-26 NOTE — ED Provider Notes (Signed)
[facility] EMERGENCY DEPARTMENT Provider Note   CSN: [id number] Arrival date & time: 11/26/18  0125    History   Chief Complaint Chief Complaint  Patient presents with  . Hand Pain    HPI Cathy Santana is a 53 y.o. female.     Patient presents to the emergency department with a chief complaint of hand pain.  She states that she put on a ring 2 days ago, and now it is stuck.  She states that it was fairly tight when she put it on, but now she cannot get it off.  She denies any numbness or tingling.  Denies any other associated symptoms.  Has tried using several over-the-counter remedies to try and slide it off, but with no success.  The history is provided by the patient. No language interpreter was used.    Past Medical History:  Diagnosis Date  . GI bleed   . Hypercholesteremia     Patient Active Problem List   Diagnosis Date Noted  . HYPERLIPIDEMIA 11/21/2008  . INSOMNIA, CHRONIC 11/21/2008    Past Surgical History:  Procedure Laterality Date  . BUNIONECTOMY  2009   bilateral 4 weeks apart  . CESAREAN SECTION     '96  . TUBAL LIGATION     '08     OB History   No obstetric history on file.      Home Medications    Prior to Admission medications   Medication Sig Start Date End Date Taking? Authorizing Provider  pravastatin (PRAVACHOL) 40 MG tablet Take 40 mg by mouth daily.    [provider]    Family History Family History  Problem Relation Age of Onset  . Stroke Father   . Stroke Sister   . Breast cancer Paternal Grandmother        unsure of age    Social History Social History   Tobacco Use  . Smoking status: Never Smoker  . Smokeless tobacco: Never Used  Substance Use Topics  . Alcohol use: No  . Drug use: No     Allergies   Codeine and Nsaids   Review of Systems Review of Systems  Constitutional: Negative for fever.  Musculoskeletal: Positive for joint swelling. Negative for arthralgias.  Skin:  Negative for color change, pallor, rash and wound.     Physical Exam Updated Vital Signs BP (!) 142/77   Pulse 73   Temp 98.1 F (36.7 C) (Oral)   Resp 18   LMP 05/05/2015   SpO2 100%   Physical Exam Vitals signs and nursing note reviewed.  Constitutional:      General: She is not in acute distress.    Appearance: She is well-developed.  HENT:     Head: Normocephalic and atraumatic.  Eyes:     Conjunctiva/sclera: Conjunctivae normal.  Neck:     Musculoskeletal: Neck supple.  Cardiovascular:     Rate and Rhythm: Normal rate.     Heart sounds: No murmur.  Pulmonary:     Effort: Pulmonary effort is normal. No respiratory distress.  Abdominal:     General: There is no distension.  Musculoskeletal:     Comments: Ring stuck on right ring finger  Skin:    General: Skin is warm and dry.  Neurological:     Mental Status: She is alert and oriented to person, place, and time.  Psychiatric:        Mood and Affect: Mood normal.  Behavior: Behavior normal.        Thought Content: Thought content normal.        Judgment: Judgment normal.      ED Treatments / Results  Labs (all labs ordered are listed, but only abnormal results are displayed) Labs Reviewed - No data to display  EKG None  Radiology No results found.  Procedures Procedures (including critical care time)  Medications Ordered in ED Medications - No data to display   Initial Impression / Assessment and Plan / ED Course  I have reviewed the triage vital signs and the nursing notes.  Pertinent labs & imaging results that were available during my care of the patient were reviewed by me and considered in my medical decision making (see chart for details).        Patient with ring stuck on right ring finger since Friday.  Sensation intact.  Delay of care due to multiple traumas presenting simultaneously.    Working with facilities to obtain ring cutter.  4:02 AM Patient eloped.  Final  Clinical Impressions(s) / ED Diagnoses   Final diagnoses:  Pain of finger, unspecified laterality    ED Discharge Orders    None       Roxy HorsemanBrowning, Hope Holst, PA-C 11/26/18 0402    Glynn Octaveancour, Stephen, MD 11/26/18 [phone]

## 2018-11-26 NOTE — ED Notes (Signed)
Patient requesting to leave, stating she "has been waiting all night to be seen." PA Browning in to speak with patient, encouraged patient to stay but patient refused and ambulated out of ED with nad stating "if my insurance gets charged for this, I am appealing it."

## 2018-12-13 ENCOUNTER — Other Ambulatory Visit: Payer: Self-pay

## 2018-12-13 ENCOUNTER — Emergency Department (HOSPITAL_COMMUNITY)
Admission: EM | Admit: 2018-12-13 | Discharge: 2018-12-13 | Discharge status: Absent without leave | Payer: BLUE CROSS/BLUE SHIELD

## 2020-03-03 ENCOUNTER — Other Ambulatory Visit: Payer: Self-pay | Admitting: Obstetrics & Gynecology

## 2020-03-03 DIAGNOSIS — Z1231 Encounter for screening mammogram for malignant neoplasm of breast: Secondary | ICD-10-CM

## 2020-03-04 ENCOUNTER — Ambulatory Visit: Payer: BLUE CROSS/BLUE SHIELD

## 2020-03-04 ENCOUNTER — Emergency Department (HOSPITAL_COMMUNITY): Admission: EM | Admit: 2020-03-04 | Discharge: 2020-03-04 | Discharge status: Against Medical Advice | Payer: 59

## 2020-03-04 NOTE — ED Notes (Signed)
Patient states she is leaving and does not want treatment because she does not want to wait in the lobby with other patients. Patient encouraged to stay but declines.

## 2020-03-04 NOTE — ED Notes (Signed)
PT state she would "go to [facility] in morning so I would not have to wait in lobby with other patients laying down looking homeless. "

## 2020-03-07 ENCOUNTER — Ambulatory Visit
Admission: RE | Admit: 2020-03-07 | Discharge: 2020-03-07 | Disposition: A | Discharge status: Home / Self Care | Payer: 59 | Source: Ambulatory Visit | Attending: Obstetrics & Gynecology | Admitting: Obstetrics & Gynecology

## 2020-03-07 ENCOUNTER — Other Ambulatory Visit: Payer: Self-pay

## 2020-03-07 DIAGNOSIS — Z1231 Encounter for screening mammogram for malignant neoplasm of breast: Secondary | ICD-10-CM

## 2020-03-13 ENCOUNTER — Encounter (HOSPITAL_COMMUNITY): Payer: Self-pay

## 2020-03-13 ENCOUNTER — Emergency Department (HOSPITAL_COMMUNITY)
Admission: EM | Admit: 2020-03-13 | Discharge: 2020-03-13 | Disposition: A | Discharge status: Home / Self Care | Payer: 59 | Attending: Emergency Medicine | Admitting: Emergency Medicine

## 2020-03-13 ENCOUNTER — Other Ambulatory Visit: Payer: Self-pay

## 2020-03-13 DIAGNOSIS — M25512 Pain in left shoulder: Secondary | ICD-10-CM | POA: Diagnosis present

## 2020-03-13 DIAGNOSIS — Z5321 Procedure and treatment not carried out due to patient leaving prior to being seen by health care provider: Secondary | ICD-10-CM | POA: Diagnosis not present

## 2020-03-13 NOTE — ED Triage Notes (Signed)
Patient arrived with complaints of left shoulder "pressure point pain" over the last three days. States she has been taking robaxin for pain, last dose at 6 pm

## 2020-03-18 ENCOUNTER — Emergency Department (HOSPITAL_COMMUNITY)
Admission: EM | Admit: 2020-03-18 | Discharge: 2020-03-18 | Disposition: A | Discharge status: Other Acute Inpatient Hospital | Payer: 59 | Attending: Emergency Medicine | Admitting: Emergency Medicine

## 2020-03-18 ENCOUNTER — Encounter (HOSPITAL_COMMUNITY): Payer: Self-pay | Admitting: Emergency Medicine

## 2020-03-18 ENCOUNTER — Emergency Department (HOSPITAL_COMMUNITY): Payer: 59

## 2020-03-18 DIAGNOSIS — F29 Unspecified psychosis not due to a substance or known physiological condition: Secondary | ICD-10-CM | POA: Insufficient documentation

## 2020-03-18 DIAGNOSIS — R000 Tachycardia, unspecified: Secondary | ICD-10-CM | POA: Insufficient documentation

## 2020-03-18 DIAGNOSIS — F309 Manic episode, unspecified: Secondary | ICD-10-CM | POA: Insufficient documentation

## 2020-03-18 DIAGNOSIS — R4689 Other symptoms and signs involving appearance and behavior: Secondary | ICD-10-CM | POA: Diagnosis present

## 2020-03-18 DIAGNOSIS — Z20822 Contact with and (suspected) exposure to covid-19: Secondary | ICD-10-CM | POA: Insufficient documentation

## 2020-03-18 LAB — CBC WITH DIFFERENTIAL/PLATELET
Abs Immature Granulocytes: 0.05 10*3/uL (ref 0.00–0.07)
Basophils Absolute: 0 10*3/uL (ref 0.0–0.1)
Basophils Relative: 0 %
Eosinophils Absolute: 0 10*3/uL (ref 0.0–0.5)
Eosinophils Relative: 0 %
HCT: 41.2 % (ref 36.0–46.0)
Hemoglobin: 13.1 g/dL (ref 12.0–15.0)
Immature Granulocytes: 1 %
Lymphocytes Relative: 33 %
Lymphs Abs: 3.1 10*3/uL (ref 0.7–4.0)
MCH: 26.1 pg (ref 26.0–34.0)
MCHC: 31.8 g/dL (ref 30.0–36.0)
MCV: 82.1 fL (ref 80.0–100.0)
Monocytes Absolute: 0.5 10*3/uL (ref 0.1–1.0)
Monocytes Relative: 5 %
Neutro Abs: 5.8 10*3/uL (ref 1.7–7.7)
Neutrophils Relative %: 61 %
Platelets: 361 10*3/uL (ref 150–400)
RBC: 5.02 MIL/uL (ref 3.87–5.11)
RDW: 14.6 % (ref 11.5–15.5)
WBC: 9.5 10*3/uL (ref 4.0–10.5)
nRBC: 0 % (ref 0.0–0.2)

## 2020-03-18 LAB — COMPREHENSIVE METABOLIC PANEL
ALT: 26 U/L (ref 0–44)
AST: 25 U/L (ref 15–41)
Albumin: 4.7 g/dL (ref 3.5–5.0)
Alkaline Phosphatase: 80 U/L (ref 38–126)
Anion gap: 15 (ref 5–15)
BUN: 11 mg/dL (ref 6–20)
CO2: 23 mmol/L (ref 22–32)
Calcium: 9.9 mg/dL (ref 8.9–10.3)
Chloride: 106 mmol/L (ref 98–111)
Creatinine, Ser: 0.83 mg/dL (ref 0.44–1.00)
GFR calc Af Amer: >60 mL/min (ref >60)
GFR calc non Af Amer: >60 mL/min (ref >60)
Glucose, Bld: 130 mg/dL — ABNORMAL HIGH (ref 70–99)
Potassium: 3.3 mmol/L — ABNORMAL LOW (ref 3.5–5.1)
Sodium: 144 mmol/L (ref 135–145)
Total Bilirubin: 0.6 mg/dL (ref 0.3–1.2)
Total Protein: 7.9 g/dL (ref 6.5–8.1)

## 2020-03-18 LAB — SALICYLATE LEVEL: Salicylate Lvl: <7 mg/dL — ABNORMAL LOW (ref 7.0–30.0)

## 2020-03-18 LAB — ACETAMINOPHEN LEVEL: Acetaminophen (Tylenol), Serum: <10 ug/mL — ABNORMAL LOW (ref 10–30)

## 2020-03-18 LAB — RAPID URINE DRUG SCREEN, HOSP PERFORMED
Amphetamines: NOT DETECTED
Barbiturates: NOT DETECTED
Benzodiazepines: NOT DETECTED
Cocaine: NOT DETECTED
Opiates: NOT DETECTED
Tetrahydrocannabinol: NOT DETECTED

## 2020-03-18 LAB — I-STAT BETA HCG BLOOD, ED (MC, WL, AP ONLY): I-stat hCG, quantitative: <5 m[IU]/mL (ref <5)

## 2020-03-18 LAB — SARS CORONAVIRUS 2 BY RT PCR (HOSPITAL ORDER, PERFORMED IN ~~LOC~~ HOSPITAL LAB): SARS Coronavirus 2: NEGATIVE

## 2020-03-18 LAB — ETHANOL: Alcohol, Ethyl (B): <10 mg/dL (ref <10)

## 2020-03-18 MED ORDER — ACETAMINOPHEN 325 MG PO TABS
650.0000 mg | ORAL_TABLET | Freq: Once | ORAL | Status: AC
  Administered 2020-03-18: 650 mg via ORAL
  Filled 2020-03-18: qty 2

## 2020-03-18 MED ORDER — ALUM & MAG HYDROXIDE-SIMETH 200-200-20 MG/5ML PO SUSP: 30.0000 mL | Freq: Four times a day (QID) | ORAL | Status: DC | PRN

## 2020-03-18 MED ORDER — ZOLPIDEM TARTRATE 5 MG PO TABS: 5.0000 mg | ORAL_TABLET | Freq: Every evening | ORAL | Status: DC | PRN

## 2020-03-18 MED ORDER — POTASSIUM CHLORIDE CRYS ER 20 MEQ PO TBCR
40.0000 meq | EXTENDED_RELEASE_TABLET | Freq: Once | ORAL | Status: AC
  Administered 2020-03-18: 40 meq via ORAL

## 2020-03-18 MED ORDER — ONDANSETRON HCL 4 MG PO TABS: 4.0000 mg | ORAL_TABLET | Freq: Three times a day (TID) | ORAL | Status: DC | PRN

## 2020-03-18 MED ORDER — ACETAMINOPHEN 325 MG PO TABS: 650.0000 mg | ORAL_TABLET | ORAL | Status: DC | PRN

## 2020-03-18 NOTE — ED Provider Notes (Signed)
[facility]-EMERGENCY DEPT Provider Note   CSN: [id number] Arrival date & time: 03/18/20  1555     History Chief Complaint  Patient presents with  . IVC    Cathy Santana is a 54 y.o. female with a history of hypercholesterolemia & prior tubal ligation who presents to the ED via police under IVC.   Per IVC paperwork the patient's mental health has been deteriorating recently, behavior has been very aggressive, she has a hand gun and has been threatening to shoot people- specifically her son, today the respondent said she was going to find her son & shoot him, she pulled her gut out and discharged it into the ground. Per police a neighbor confirmed a weapon was discharged. Patient agitated with police.   Upon my assessment of the patient she states that she is very aggravated with her biological son, he has been unemployed and living in her home for 3 years now, she states he is lazy and steals from her and she is sick of it. Today she ran out of gas and returned home to get her gas tank and it was not there, she presumed her son stole it and became very angry. She was looking for him, at one point she got into an argument with her mother about this and discharged her weapon into the ground.  She states her family members are lazy and that she is the only one who is successful, she informed me of the multiple expensive vehicles she owns as well as their prices and that she has a very large home.  Patient states she is going to sue and get all of the police officers fired.  HPI     Past Medical History:  Diagnosis Date  . GI bleed   . Hypercholesteremia     Patient Active Problem List   Diagnosis Date Noted  . HYPERLIPIDEMIA 11/21/2008  . INSOMNIA, CHRONIC 11/21/2008    Past Surgical History:  Procedure Laterality Date  . BUNIONECTOMY  2009   bilateral 4 weeks apart  . CESAREAN SECTION     '96  . TUBAL LIGATION     '08     OB History   No obstetric  history on file.     Family History  Problem Relation Age of Onset  . Stroke Father   . Stroke Sister   . Breast cancer Paternal Grandmother        unsure of age    Social History   Tobacco Use  . Smoking status: Never Smoker  . Smokeless tobacco: Never Used  Vaping Use  . Vaping Use: Never used  Substance Use Topics  . Alcohol use: No  . Drug use: No    Home Medications Prior to Admission medications   Medication Sig Start Date End Date Taking? Authorizing Provider  pravastatin (PRAVACHOL) 40 MG tablet Take 40 mg by mouth daily.    [provider]    Allergies    Codeine and Nsaids  Review of Systems   Review of Systems  Constitutional: Negative for chills and fever.  Respiratory: Negative for shortness of breath.   Cardiovascular: Negative for chest pain.  Gastrointestinal: Negative for abdominal pain.  Neurological: Negative for syncope.  Psychiatric/Behavioral: Positive for agitation and behavioral problems. Negative for hallucinations and suicidal ideas.  All other systems reviewed and are negative.   Physical Exam Updated Vital Signs BP (!) 159/94 (BP Location: Right Arm)   Pulse (!) 110   Temp 98.6  F (37 C) (Oral)   Resp 20   LMP 05/05/2015   SpO2 100%   Physical Exam Vitals and nursing note reviewed.  Constitutional:      General: She is not in acute distress.    Appearance: She is well-developed. She is not toxic-appearing.  HENT:     Head: Normocephalic and atraumatic.  Eyes:     General:        Right eye: No discharge.        Left eye: No discharge.     Conjunctiva/sclera: Conjunctivae normal.  Cardiovascular:     Rate and Rhythm: Regular rhythm. Tachycardia present.  Pulmonary:     Effort: Pulmonary effort is normal. No respiratory distress.     Breath sounds: Normal breath sounds. No wheezing, rhonchi or rales.  Abdominal:     General: There is no distension.     Palpations: Abdomen is soft.     Tenderness: There is no  abdominal tenderness.  Musculoskeletal:     Cervical back: Neck supple.  Skin:    General: Skin is warm and dry.     Findings: No rash.  Neurological:     Mental Status: She is alert.     Comments: Clear speech.   Psychiatric:     Comments: Patient initially in handcuffs. Visibly frustrated, very irritable & agitated.      ED Results / Procedures / Treatments   Labs (all labs ordered are listed, but only abnormal results are displayed) Labs Reviewed  COMPREHENSIVE METABOLIC PANEL - Abnormal; Notable for the following components:      Result Value   Potassium 3.3 (*)    Glucose, Bld 130 (*)    All other components within normal limits  SALICYLATE LEVEL - Abnormal; Notable for the following components:   Salicylate Lvl <7.0 (*)    All other components within normal limits  ACETAMINOPHEN LEVEL - Abnormal; Notable for the following components:   Acetaminophen (Tylenol), Serum <10 (*)    All other components within normal limits  SARS CORONAVIRUS 2 BY RT PCR (HOSPITAL ORDER, PERFORMED IN [facility])  RAPID URINE DRUG SCREEN, HOSP PERFORMED  CBC WITH DIFFERENTIAL/PLATELET  ETHANOL  I-STAT BETA HCG BLOOD, ED ([facility], [facility], [facility] ONLY)    EKG None  Radiology DG Wrist Complete Left  Result Date: 03/18/2020 CLINICAL DATA:  Pain of wrist with cuffs left greater than right EXAM: LEFT WRIST - COMPLETE 3+ VIEW COMPARISON:  X-ray left wrist 01/15/2018. FINDINGS: There is no evidence of fracture or dislocation. There is no evidence of arthropathy or other focal bone abnormality. Soft tissues are unremarkable. IMPRESSION: Negative. Electronically Signed   By: Tish Frederickson M.D.   On: 03/18/2020 20:00   DG Wrist Complete Right  Result Date: 03/18/2020 CLINICAL DATA:  Pain bilateral wrists with cuffs EXAM: RIGHT WRIST - COMPLETE 3+ VIEW COMPARISON:  None. FINDINGS: There is no evidence of fracture or dislocation. There is no evidence of arthropathy or other focal bone abnormality.  Soft tissues are unremarkable. IMPRESSION: Negative. Electronically Signed   By: Tish Frederickson M.D.   On: 03/18/2020 20:00   DG Shoulder Left  Result Date: 03/18/2020 CLINICAL DATA:  Pain bilateral wrists, left greater than right, radiating to left shoulder EXAM: LEFT SHOULDER - 2+ VIEW COMPARISON:  None. FINDINGS: There is no evidence of fracture or dislocation. There is no evidence of arthropathy or other focal bone abnormality. Soft tissues are unremarkable. IMPRESSION: Negative. Electronically Signed   By: Blanchie Serve  Tessie Fass M.D.   On: 03/18/2020 20:01    Procedures Procedures (including critical care time)  Medications Ordered in ED Medications  potassium chloride SA (KLOR-CON) CR tablet 40 mEq (40 mEq Oral Given 03/18/20 2154)  acetaminophen (TYLENOL) tablet 650 mg (650 mg Oral Given 03/18/20 2155)    ED Course  I have reviewed the triage vital signs and the nursing notes.  Pertinent labs & imaging results that were available during my care of the patient were reviewed by me and considered in my medical decision making (see chart for details).   MDM Rules/Calculators/A&P                         Patient presents to the ED under IVC- history per IVC paperwork, police, & patient above She is mildly tachycardic & hypertensive, agitated, exam otherwise benign.  I explained process of TTS consultation to the patient with need for labs, vitals, and her cooperation, she was agreeable & handcuffs were removed.   Additional history obtained:  Additional history obtained from police, IVC paperwork. Previous records obtained and reviewed.   Lab Tests:  I reviewed and interpreted labs, which included:  Pregnancy test, CBC, CMP, salicylate level, acetaminophen level, ethanol level, and UDS -fairly unremarkable, oral potassium ordered for supplementation. 15:30: RE-EVAL: Patient eating at this time, resting comfortably.  18:45: RE-EVAL:Patient complaining of left shoulder pain and bilateral  wrist pain after being placed in handcuffs by police.  X-rays ordered.  Neurovascularly intact distally to each location.--> x-rays without acute fracture or dislocation.  Tylenol ordered for pain.  Patiently medically cleared.  Per TTS meets inpatient criteria, pending placement.  Portions of this note were generated with Scientist, clinical (histocompatibility and immunogenetics). Dictation errors may occur despite best attempts at proofreading.  Final Clinical Impression(s) / ED Diagnoses Final diagnoses:  Aggressive behavior    Rx / DC Orders ED Discharge Orders    None       Cherly Anderson, PA-C 03/18/20 2234    Maia Plan, MD 03/19/20 1042

## 2020-03-18 NOTE — BH Assessment (Signed)
Comprehensive Clinical Assessment (CCA) Note  03/18/2020 Cathy Santana [id number]   Cathy Santana is a 54 y.o. female who presents to [facility] under IVC by [employer] police. Per EDP report, "Per IVC paperwork the patient's mental health has been deteriorating recently, behavior has been very aggressive, she has a hand gun and has been threatening to shoot people- specifically her son, today the respondent said she was going to find her son & shoot him, she pulled her gut out and discharged it into the ground. Per police a neighbor confirmed a weapon was discharged. Patient agitated with police." During assessment pt presented irritable, manic with rapid and tangential speech. Pt oriented x3. 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 Pt refers to her mother as a bitch and is very angry towards her. Pt states that her mother and father did not get along and that he died in a car accident in 11-12-10 and that her mother hated her father. During assessment pt becomes very tearful. Pt currently denies SI, HI, AVH and SIB. When asked if she currently had a provider pt states" I dont need one she does", referring to her mother. Throughout assessment pt continues on tangential and rapid speech, provides limited social history. Pt did also deny drug usage, labs currently pending.   Per IVC: "Per IVC paperwork the patient's mental health has been deteriorating recently, the respondents behavior has been very aggressive, for instance she has a hand gun and has been threatening to shoot the people, specifically her son. Today the respondent said she was going to find her son and shoot him. The respondent then pulled out her gun and discharged it. The respondent continues to have angry outburst periodically and is in need of intervention."    Diagnosis: Brief Psychotic Disorder Disposition: Nira Conn, FNP recommends pt for inpatient treatment.    Visit Diagnosis:   IVC/ mania   CCA Screening, Triage and Referral (STR)  Patient Reported Information How did you hear about [place]? [employer] Referral name: [employer] Referral phone number: No data recorded  Whom do you see for routine medical problems? I don't have a doctor  Practice/Facility Name: No data recorded Practice/Facility Phone Number: No data recorded Name of Contact: No data recorded Contact Number: No data recorded Contact Fax Number: No data recorded Prescriber Name: No data recorded Prescriber Address (if known): No data recorded  What Is the Reason for Your Visit/Call Today? No data recorded How Long Has This Been Causing You Problems? <Week  What Do You Feel Would Help You the Most Today? Assessment Only   Have You Recently Been in Any Inpatient Treatment (Hospital/Detox/Crisis Center/28-Day Program)? No  Name/Location of Program/Hospital:No data recorded How Long Were You There? No data recorded When Were You Discharged? No data recorded  Have You Ever Received Services From [facility] Before? No  Who Do You See at [facility]? No data recorded  Have You Recently Had Any Thoughts About Hurting Yourself? No  Are You Planning to Commit Suicide/Harm Yourself At This time? No   Have you Recently Had Thoughts About Hurting Someone Karolee Ohs? No  Explanation: No data recorded  Have You Used Any Alcohol or Drugs in the Past 24 Hours? No  How Long Ago Did You Use Drugs or Alcohol? No data recorded What Did You Use and How Much? No data recorded  Do You Currently Have a Therapist/Psychiatrist? No  Name of Therapist/Psychiatrist: No data recorded  Have You Been Recently Discharged From Any Office Practice or Programs? No  Explanation of Discharge From Practice/Program: No data recorded    CCA Screening Triage Referral Assessment Type of Contact: Tele-Assessment  Is this Initial or Reassessment? Initial  Assessment  Date Telepsych consult ordered in [facility]:  03/18/20  Time Telepsych consult ordered in [facility]:  2023   Patient Reported Information Reviewed? Yes  Patient Left Without Being Seen? No data recorded Reason for Not Completing Assessment: No data recorded  Collateral Involvement: No data recorded  Does Patient Have a Court Appointed Legal Guardian? No data recorded Name and Contact of Legal Guardian: No data recorded If Minor and Not Living with Parent(s), Who has Custody? No data recorded Is CPS involved or ever been involved? Never  Is APS involved or ever been involved? Never   Patient Determined To Be At Risk for Harm To Self or Others Based on Review of Patient Reported Information or Presenting Complaint? No  Method: No data recorded Availability of Means: No data recorded Intent: No data recorded Notification Required: No data recorded Additional Information for Danger to Others Potential: No data recorded Additional Comments for Danger to Others Potential: No data recorded Are There Guns or Other Weapons in Your Home? No data recorded Types of Guns/Weapons: No data recorded Are These Weapons Safely Secured?                            No data recorded Who Could Verify You Are Able To Have These Secured: No data recorded Do You Have any Outstanding Charges, Pending Court Dates, Parole/Probation? No data recorded Contacted To Inform of Risk of Harm To Self or Others: No data recorded  Location of Assessment: [facility]   Does Patient Present under Involuntary Commitment? Yes  IVC Papers Initial File Date: 03/18/20   [place] of Residence: [place]   Patient Currently Receiving the Following Services: No data recorded  Determination of Need: Emergent (2 hours)   Options For Referral: Inpatient Treatment    CCA Biopsychosocial  Intake/Chief Complaint:  CCA Intake With Chief Complaint CCA Part Two Date: 03/18/20 CCA Part Two Time: 2034 Chief  Complaint/Presenting Problem:  (IVC)  Mental Health Symptoms Depression:  Depression:  (UTA)  Mania:  Mania: Increased Energy  Anxiety:   Anxiety:  (UTA)  Psychosis:  Psychosis:  (UTA)  Trauma:  Trauma:  (UTA)  Obsessions:   UTA  Compulsions:   UTA  Inattention:   UTA  Hyperactivity/Impulsivity:   UTA  Oppositional/Defiant Behaviors:   UTA  Emotional Irregularity:   UTA  Other Mood/Personality Symptoms:   UTA   Mental Status Exam Appearance and self-care  Stature:  Stature: Average  Weight:  Weight: Average weight  Clothing:  Clothing: Casual  Grooming:  Grooming: Normal  Cosmetic use:  Cosmetic Use: Age appropriate  Posture/gait:  Posture/Gait: Normal  Motor activity:  Motor Activity: Repetitive  Sensorium  Attention:  Attention: Distractible  Concentration:  Concentration: Focuses on irrelevancies, Variable  Orientation:  Orientation: Time, Place, Person  Recall/memory:  Recall/Memory: Normal  Affect and Mood  Affect:  Affect: Labile  Mood:  Mood: Euphoric  Relating  Eye contact:  Eye Contact: Staring  Facial expression:  Facial Expression: Responsive  Attitude toward examiner:  Attitude Toward Examiner: Argumentative, Dramatic  Thought and Language  Speech flow: Speech Flow: Loud, Pressured, Flight of Ideas  Thought content:  Thought Content: Personalizations  Preoccupation:  Preoccupations: Other (Comment)  Hallucinations:  Hallucinations: None  Organization:     Company secretary of Knowledge:  Fund of Knowledge: Good  Intelligence:  Intelligence: Above Average  Abstraction:  Abstraction: Overly abstract  Judgement:  Judgement: Impaired  Reality Testing:  Reality Testing: Variable  Insight:  Insight: Good  Decision Making:     Social Functioning  Social Maturity:  Social Maturity: Self-centered  Social Judgement:  Social Judgement: Normal  Stress  Stressors:  Stressors: Family conflict, Other (Comment)  Coping Ability:     Skill Deficits:      Supports:        Religion: Religion/Spirituality Are You A Religious Person?:  (UTA) How Might This Affect Treatment?:  (UTA)  Leisure/Recreation:    Exercise/Diet: Exercise/Diet Do You Exercise?:  (UTA) Do You Follow a Special Diet?:  (UTA) Do You Have Any Trouble Sleeping?:  (UTA)   CCA Employment/Education  Employment/Work Situation: Employment / Work Situation Employment situation: Employed Has patient ever been in the [employer]?: No  Education: Education Is Patient Currently Attending School?: No Did Garment/textile technologist From [employer]?: Yes Did Theme park manager?: Yes Did You Have Any Scientist, research (life sciences) In School?:  Banker) Did You Have An Individualized Education Program (IIEP): No Did You Have Any Difficulty At [employer]?: No Patient's Education Has Been Impacted by Current Illness: No   CCA Family/Childhood History  Family and Relationship History: Family history 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, hospital) Does patient have children?: Yes How many children?: 1  Childhood History:  Childhood History By whom was/is the patient raised?: Both parents Does patient have siblings?: Yes Did patient suffer any verbal/emotional/physical/sexual abuse as a child?:  (UTA) Has patient ever been sexually abused/assaulted/raped as an adolescent or adult?:  (UTA) Was the patient ever a victim of a crime or a disaster?:  (UTA) Witnessed domestic violence?:  (UTA) Has patient been affected by domestic violence as an adult?:  Industrial/product designer)  Child/Adolescent Assessment:     CCA Substance Use  Alcohol/Drug Use: Pt denies alcohol use Alcohol / Drug Use Pain Medications: see MAR History of alcohol / drug use?: No history of alcohol / drug abuse (Pt denies drug use)              ASAM's:  Six Dimensions of Multidimensional Assessment  Dimension 1:  Acute Intoxication and/or Withdrawal Potential:      Dimension 2:  Biomedical Conditions and Complications:       Dimension 3:  Emotional, Behavioral, or Cognitive Conditions and Complications:     Dimension 4:  Readiness to Change:     Dimension 5:  Relapse, Continued use, or Continued Problem Potential:     Dimension 6:  Recovery/Living Environment:     ASAM Severity Score:    ASAM Recommended Level of Treatment:     Substance use Disorder (SUD)    Recommendations for Services/Supports/Treatments:NONE Recommendations for Services/Supports/Treatments Recommendations For Services/Supports/Treatments: Other (Comment)  DSM5 Diagnoses: Patient Active Problem List   Diagnosis Date Noted  . HYPERLIPIDEMIA 11/21/2008  . INSOMNIA, CHRONIC 11/21/2008      Natasha Mead, LCSWA

## 2020-03-18 NOTE — ED Triage Notes (Signed)
Per IVC paperwork, states her mom took papers out on her-states her mental health has been declining-states she has been threatening her son with a gun-patient denies what her mother is saying

## 2020-03-19 ENCOUNTER — Inpatient Hospital Stay (HOSPITAL_COMMUNITY)
Admission: AD | Admit: 2020-03-19 | Discharge: 2020-03-21 | DRG: 885 | Disposition: A | Discharge status: Home / Self Care | Payer: 59 | Source: Other Acute Inpatient Hospital | Attending: Psychiatry | Admitting: Psychiatry

## 2020-03-19 ENCOUNTER — Other Ambulatory Visit: Payer: Self-pay

## 2020-03-19 ENCOUNTER — Encounter (HOSPITAL_COMMUNITY): Payer: Self-pay | Admitting: Psychiatry

## 2020-03-19 DIAGNOSIS — F419 Anxiety disorder, unspecified: Secondary | ICD-10-CM | POA: Diagnosis present

## 2020-03-19 DIAGNOSIS — F23 Brief psychotic disorder: Secondary | ICD-10-CM | POA: Diagnosis present

## 2020-03-19 DIAGNOSIS — F312 Bipolar disorder, current episode manic severe with psychotic features: Secondary | ICD-10-CM | POA: Diagnosis present

## 2020-03-19 DIAGNOSIS — Z888 Allergy status to other drugs, medicaments and biological substances status: Secondary | ICD-10-CM

## 2020-03-19 DIAGNOSIS — F3113 Bipolar disorder, current episode manic without psychotic features, severe: Secondary | ICD-10-CM

## 2020-03-19 DIAGNOSIS — G4700 Insomnia, unspecified: Secondary | ICD-10-CM | POA: Diagnosis present

## 2020-03-19 DIAGNOSIS — Z20822 Contact with and (suspected) exposure to covid-19: Secondary | ICD-10-CM | POA: Diagnosis present

## 2020-03-19 DIAGNOSIS — R45850 Homicidal ideations: Secondary | ICD-10-CM | POA: Diagnosis present

## 2020-03-19 DIAGNOSIS — Z885 Allergy status to narcotic agent status: Secondary | ICD-10-CM

## 2020-03-19 MED ORDER — OLANZAPINE 10 MG PO TBDP
10.0000 mg | ORAL_TABLET | Freq: Every day | ORAL | Status: DC
  Administered 2020-03-20: 10 mg via ORAL
  Filled 2020-03-19 (×2): qty 1

## 2020-03-19 MED ORDER — LORAZEPAM 1 MG PO TABS: 1.0000 mg | ORAL_TABLET | ORAL | Status: DC | PRN

## 2020-03-19 MED ORDER — LORAZEPAM 2 MG/ML IJ SOLN
INTRAMUSCULAR | Status: AC
  Filled 2020-03-19: qty 1

## 2020-03-19 MED ORDER — ACETAMINOPHEN 325 MG PO TABS
650.0000 mg | ORAL_TABLET | Freq: Four times a day (QID) | ORAL | Status: DC | PRN
  Administered 2020-03-20: 650 mg via ORAL
  Filled 2020-03-19: qty 2

## 2020-03-19 MED ORDER — TRAZODONE HCL 50 MG PO TABS: 50.0000 mg | ORAL_TABLET | Freq: Every evening | ORAL | Status: DC | PRN

## 2020-03-19 MED ORDER — LORAZEPAM 2 MG/ML IJ SOLN
2.0000 mg | Freq: Four times a day (QID) | INTRAMUSCULAR | Status: DC | PRN
  Administered 2020-03-19: 2 mg via INTRAMUSCULAR

## 2020-03-19 MED ORDER — OLANZAPINE 10 MG PO TBDP
10.0000 mg | ORAL_TABLET | Freq: Two times a day (BID) | ORAL | Status: DC
  Administered 2020-03-19: 10 mg via ORAL
  Filled 2020-03-19 (×6): qty 1

## 2020-03-19 MED ORDER — HYDROXYZINE HCL 25 MG PO TABS: 25.0000 mg | ORAL_TABLET | Freq: Three times a day (TID) | ORAL | Status: DC | PRN

## 2020-03-19 MED ORDER — MAGNESIUM HYDROXIDE 400 MG/5ML PO SUSP: 30.0000 mL | Freq: Every day | ORAL | Status: DC | PRN

## 2020-03-19 MED ORDER — ALUM & MAG HYDROXIDE-SIMETH 200-200-20 MG/5ML PO SUSP: 30.0000 mL | ORAL | Status: DC | PRN

## 2020-03-19 MED ORDER — ZIPRASIDONE MESYLATE 20 MG IM SOLR
20.0000 mg | INTRAMUSCULAR | Status: DC | PRN
  Filled 2020-03-19: qty 20

## 2020-03-19 MED ORDER — DIPHENHYDRAMINE HCL 50 MG/ML IJ SOLN
INTRAMUSCULAR | Status: AC
  Filled 2020-03-19: qty 1

## 2020-03-19 MED ORDER — OLANZAPINE 5 MG PO TBDP
15.0000 mg | ORAL_TABLET | Freq: Every day | ORAL | Status: DC
  Filled 2020-03-19 (×2): qty 1

## 2020-03-19 MED ORDER — OLANZAPINE 10 MG PO TBDP: 10.0000 mg | ORAL_TABLET | Freq: Three times a day (TID) | ORAL | Status: DC | PRN

## 2020-03-19 MED ORDER — DIPHENHYDRAMINE HCL 50 MG/ML IJ SOLN
25.0000 mg | Freq: Four times a day (QID) | INTRAMUSCULAR | Status: DC | PRN
  Administered 2020-03-19: 25 mg via INTRAMUSCULAR

## 2020-03-19 NOTE — Progress Notes (Signed)
Spoke with pt's boyfriend, Vergie Living. Gave code number to him and told him that pt was at [facility].

## 2020-03-19 NOTE — [facility] Suicide Risk Assessment (Signed)
[facility] Admission Suicide Risk Assessment   Nursing information obtained from:  Patient Demographic factors:  Living alone, Access to firearms Current Mental Status:  Suicidal ideation indicated by others, Thoughts of violence towards others Loss Factors:  NA Historical Factors:  Impulsivity Risk Reduction Factors:  Sense of responsibility to family, Employed  Total Time spent with patient: 30 minutes Principal Problem: <principal problem not specified> Diagnosis:  Active Problems:   Brief psychotic disorder (HCC)  Subjective Data: Patient is seen and examined.  Patient is a 54 year old female which according to the patient has a negative past psychiatric history who was placed under involuntary commitment by [facility] police on 03/18/2020.  Per the report the patient had been very aggressive.  She apparently had a handgun and was threatening to shoot people.  Specifically she was going to shoot her son.  She was going to find her son and shooting.  She pulled out the gun and discharged today on the ground.  Per police neighbor confirmed a weapon was discharged.  She was then picked up by police.  During the assessment in the [facility] emergency department she was angry, threatening.  She is very disorganized.  She was very labile.  She would become tearful.  She was transferred to our facility for continued evaluation and stabilization.  On examination this morning she has been on the phone at least 10 times since I got here, very pressured, very tangential, very agitated.  She denied any history of bipolar disorder.  She denied any other psychiatric history.  She denied ever previously been psychiatrically admitted or psychiatrically treated.  She was admitted to the hospital for evaluation and stabilization.  Continued Clinical Symptoms:  Alcohol Use Disorder Identification Test Final Score (AUDIT): 0 The "Alcohol Use Disorders Identification Test", Guidelines for Use in  Primary Care, Second Edition.  World Science writer [facility]). Score between 0-7:  no or low risk or alcohol related problems. Score between 8-15:  moderate risk of alcohol related problems. Score between 16-19:  high risk of alcohol related problems. Score 20 or above:  warrants further diagnostic evaluation for alcohol dependence and treatment.   CLINICAL FACTORS:   Bipolar Disorder:   Mixed State   Musculoskeletal: Strength & Muscle Tone: within normal limits Gait & Station: normal Patient leans: N/A  Psychiatric Specialty Exam: Physical Exam Vitals and nursing note reviewed.  HENT:     Head: Normocephalic and atraumatic.  Pulmonary:     Effort: Pulmonary effort is normal.  Neurological:     General: No focal deficit present.     Mental Status: She is alert.     Review of Systems  Blood pressure (!) 155/93, pulse 80, temperature 98.4 F (36.9 C), temperature source Oral, resp. rate 18, last menstrual period 05/05/2015, SpO2 100 %.There is no height or weight on file to calculate BMI.  General Appearance: Disheveled  Eye Contact:  Fair  Speech:  Pressured  Volume:  Increased  Mood:  Irritable  Affect:  Labile  Thought Process:  Goal Directed and Descriptions of Associations: Tangential  Orientation:  Negative  Thought Content:  Delusions, Rumination and Tangential  Suicidal Thoughts:  No  Homicidal Thoughts:  No  Memory:  Immediate;   Poor Recent;   Poor Remote;   Poor  Judgement:  Impaired  Insight:  Lacking  Psychomotor Activity:  Increased  Concentration:  Concentration: Poor and Attention Span: Poor  Recall:  Poor  Fund of Knowledge:  Fair  Language:  Good  Akathisia:  Negative  Handed:  Right  AIMS (if indicated):     Assets:  Desire for Improvement Resilience  ADL's:  Intact  Cognition:  WNL  Sleep:  Number of Hours: 0      COGNITIVE FEATURES THAT CONTRIBUTE TO RISK:  Thought constriction (tunnel vision)    SUICIDE RISK:   Moderate:   Frequent suicidal ideation with limited intensity, and duration, some specificity in terms of plans, no associated intent, good self-control, limited dysphoria/symptomatology, some risk factors present, and identifiable protective factors, including available and accessible social support.  PLAN OF CARE: Patient is seen and examined.  Patient is a 54 year old female with the above-stated past psychiatric history was transferred to our facility for evaluation and stabilization.  She will be admitted to the hospital.  She will be integrated in the milieu.  She will be encouraged to attend groups.  It does appear that she has bipolar disorder.  She denies any previous psychiatric history.  She has already refused medication, and I asked believe she will most likely need forced medications.  I have already written for the agitation protocol.  We will use the Zyprexa agitation protocol.  We will attempt to give her Zyprexa 10 mg p.o. twice daily and titrate that during the course of the hospitalization.  We will contact her son or family members for collateral information.  Review of the electronic medical record was unable to disclose any previous psychiatric treatment or medications that were psychiatric in origin.  Review of her admission laboratories revealed a mildly low potassium at 3.3.  A mildly elevated glucose at 130.  The rest of her electrolytes including liver function enzymes were normal.  CBC was normal.  Differential was normal.  Acetaminophen was less than 10, salicylate less than 7.  Beta-hCG was less than 5.  Blood alcohol was less than 10.  Drug screen was completely negative.  X-rays in the emergency room of her left shoulder were negative.  X-rays of her wrist were negative.  She had a mammogram done on 9/3 that was negative.  TSH was ordered this morning.  I certify that inpatient services furnished can reasonably be expected to improve the patient's condition.   Antonieta Pert,  MD 03/19/2020, 9:06 AM

## 2020-03-19 NOTE — Progress Notes (Signed)
Pt at nurse's station again speaking about her situation. Pt asked to be respectful of other pts and to stay in her room. Pt told that her boyfriend was contacted. Pt agreed to stay in her room. Will continue to monitor.

## 2020-03-19 NOTE — Progress Notes (Signed)
Pt at nurse's station. Pt cursing and yelling at staff and other pts. "This motherfucker is getting shut down today. Pt not wearing masks. I know Lollie Sails and other judges and I'm getting this place shut down and you are all getting fired." Informed AC and asked her to come and speak to pt. Pt still ranting and asking for staff names. "You are playing games. I am damn near a genius and an intelligent black woman. I have an appointment at 0830 this morning. I have female problems and I will make my appointment if I have to walk out of here. Call the police if you have to but I am making my appointment." Pt hard to redirect. Still standing at nurse's station.

## 2020-03-19 NOTE — Progress Notes (Signed)
Pt seen at nurse's station. Pt has been up since admission, verbally aggressive, rapid speech. "Everybody getting fired. I have been held against my will. This is bullshit. That bitch who calls herself my mother is not my mother. She did this." Pt refuses to sleep. "I sleep every other day. I slept yesterday. I'm not sleeping tonight." Pt states that she has a doctor's appointment in the morning at 0830 for "female problems." "If I miss my appointment, people getting fired." Pt gave me permission to call boyfriend, Vergie Living, [phone], and give him the code.

## 2020-03-19 NOTE — BHH Counselor (Signed)
CSW attempted to complete PSA, however this patient was sleeping at this time.   CSW will attempt to complete PSA at a later time.   Ruthann Cancer MSW, LCSW Clincal Social Worker  [facility]

## 2020-03-19 NOTE — Plan of Care (Signed)
Attempted to contact son. No answer and messages were full and unable to leave a message.

## 2020-03-19 NOTE — Progress Notes (Addendum)
Pt denies SI and HI.  She presents with tangential, pressured speech and she is delusional.  Pt also presents with grandiose thoughts "I have 3 cars, 5 jobs (a Charity fundraiser, an Advertising account planner, a Arts development officer) etc. Pt is irritable and she is asking to be discharged.  PRN order for IM ativan and benadryl was obtained. Pt tolerated IM injections without incident.   Q 15 min checks remain in place.  RN will monitor and intervene as needed.

## 2020-03-19 NOTE — Progress Notes (Signed)
Pt is a 54 y.o. female IVC'd by [employer] from [facility]. Pt wasn't compliant with the admission process at first. Pt refused to cooperate with the noninvasive skin assessment or sign any admission paperwork. Pt said her rights were being violated and she's a Charity fundraiser. She said she's educated and shouldn't be here. Pt said she has a 4 bedroom house and 3 cars. She said she has a lot of money and also does Human resources officer for a living. Pt threatens to sue everyone here at [facility], [facility], and the police that arrested her. She said she has a friend that's is a judge and mentioned the name. Pt has rapid speech, is manic, tangential, and hyperverbal. She is agitated and aggressive with this Clinical research associate during the process. She has a 15 year old son who she had threatened to shoot with a gun and said that she does have a hand gun in her home. Pt denies HI toward anyone during the admission process. Per [facility] nurse, pt threatened her son because he took her gas car out of the garage. 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 She said she also needs someone to look after her dog at home.   Pt denies SI/HI and AVH. Pt only signed the telephone consent form so her boyfriend can be informed about her admission. Pt refused to sign the other consents forms. Skin assessment was completed. Contraband secured in assigned locker. Noted small bruise to left wrist, pt said it was from the hand cuffs she was in. Pt oriented to the unit and informed about unit rules/policies. Food/fluids offered. Active listening, reassurance, and support provided. Q 15 min safety checks continue. Pt's safety has been maintained.

## 2020-03-19 NOTE — H&P (Signed)
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  Patient is a 54 year old female which according to the patient has a negative past psychiatric history who was placed under involuntary commitment by [facility] police on 03/18/2020.  Per the report the patient had been very aggressive.  She apparently had a handgun and was threatening to shoot people.  Specifically she was going to shoot her son.  She was going to find her son and shooting.  She pulled out the gun and discharged today on the ground.  Per police neighbor confirmed a weapon was discharged.  She was then picked up by police.  During the assessment in the [facility] emergency department she was angry, threatening.  She is very disorganized.  She was very labile.  She would become tearful.  She was transferred to our facility for continued evaluation and stabilization.  On examination this morning she has been on the phone at least 10 times since I got here, very pressured, very tangential, very agitated.  She denied any history of bipolar disorder.  She denied any other psychiatric history.  She denied ever previously been psychiatrically admitted or psychiatrically treated.  She was admitted to the hospital for evaluation and stabilization.  Associated Signs/Symptoms: Depression Symptoms:  insomnia, psychomotor agitation, anxiety, disturbed sleep, Duration of Depression Symptoms: No data recorded (Hypo) Manic Symptoms:  Delusions, Elevated Mood, Impulsivity, Irritable Mood, Labiality of Mood, Anxiety Symptoms:  Excessive Worry, Psychotic Symptoms:  Delusions, Paranoia, Duration of Psychotic Symptoms: No  data recorded PTSD Symptoms: Negative Total Time spent with patient: 45 minutes  Past Psychiatric History: Patient denied any previous psychiatric evaluations, previous psychiatric treatment, previous psychiatric admissions, or any psychiatric medications.  Is the patient at risk to self? Yes.    Has the patient been a risk to self in the past 6 months? No.  Has the patient been a risk to self within the distant past? No.  Is the patient a risk to others? Yes.    Has the patient been a risk to others in the past 6 months? Yes.    Has the patient been a risk to others within the distant past? No.   Prior Inpatient Therapy:   Prior Outpatient Therapy:    Alcohol Screening: Patient refused Alcohol Screening Tool:  (no) 1. How often do you have a drink containing alcohol?: Never 2. How many drinks containing alcohol do you have on a typical day when you are drinking?: 1 or 2 3. How often do you have six or more drinks on one occasion?: Never AUDIT-C Score: 0 4. How often during the last year have you found that you were not able to stop drinking once you had started?: Never 5. How often during the last year have you failed to do what was normally expected from you because of drinking?: Never 6. How often during the last year have you needed a first drink in the morning to get yourself going after a heavy drinking session?: Never 7. How often during the last year have you had a feeling of guilt of remorse after drinking?: Never 8. How often during the last year have you been unable to remember what happened the night before because you had been drinking?: Never  9. Have you or someone else been injured as a result of your drinking?: No 10. Has a relative or friend or a doctor or another health worker been concerned about your drinking or suggested you cut down?: No Alcohol Use Disorder Identification Test Final Score (AUDIT): 0 Alcohol Brief Interventions/Follow-up: Patient Refused Substance  Abuse History in the last 12 months:  No. Consequences of Substance Abuse: Negative Previous Psychotropic Medications: No  Psychological Evaluations: No  Past Medical History:  Past Medical History:  Diagnosis Date  . GI bleed   . Hypercholesteremia     Past Surgical History:  Procedure Laterality Date  . BUNIONECTOMY  2009   bilateral 4 weeks apart  . CESAREAN SECTION     '96  . TUBAL LIGATION     '08   Family History:  Family History  Problem Relation Age of Onset  . Stroke Father   . Stroke Sister   . Breast cancer Paternal Grandmother        unsure of age   Family Psychiatric  History: Patient denied Tobacco Screening:   Social History:  Social History   Substance and Sexual Activity  Alcohol Use No     Social History   Substance and Sexual Activity  Drug Use No    Additional Social History:                           Allergies:   Allergies  Allergen Reactions  . Codeine Nausea And Vomiting  . Nsaids     GI bleed    Lab Results:  Results for orders placed or performed during the hospital encounter of 03/18/20 (from the past 48 hour(s))  Urine rapid drug screen (hosp performed)     Status: None   Collection Time: 03/18/20  4:46 PM  Result Value Ref Range   Opiates NONE DETECTED NONE DETECTED   Cocaine NONE DETECTED NONE DETECTED   Benzodiazepines NONE DETECTED NONE DETECTED   Amphetamines NONE DETECTED NONE DETECTED   Tetrahydrocannabinol NONE DETECTED NONE DETECTED   Barbiturates NONE DETECTED NONE DETECTED    Comment: (NOTE) DRUG SCREEN FOR MEDICAL PURPOSES ONLY.  IF CONFIRMATION IS NEEDED FOR ANY PURPOSE, NOTIFY LAB WITHIN 5 DAYS.  LOWEST DETECTABLE LIMITS FOR URINE DRUG SCREEN Drug Class                     Cutoff (ng/mL) Amphetamine and metabolites    1000 Barbiturate and metabolites    200 Benzodiazepine                 200 Tricyclics and metabolites     300 Opiates and metabolites        300 Cocaine and metabolites         300 THC                            50 Performed at [facility], [address], [place], [place] [address]   SARS Coronavirus 2 by RT PCR (hospital order, performed in [facility] hospital lab) Nasopharyngeal Nasopharyngeal Swab     Status: None   Collection Time: 03/18/20  4:52 PM   Specimen: Nasopharyngeal Swab  Result Value Ref Range   SARS Coronavirus 2 NEGATIVE NEGATIVE    Comment: (NOTE) SARS-CoV-2 target nucleic acids are NOT DETECTED.  The SARS-CoV-2 RNA is generally detectable in upper and lower respiratory specimens during  the acute phase of infection. The lowest concentration of SARS-CoV-2 viral copies this assay can detect is 250 copies / mL. A negative result does not preclude SARS-CoV-2 infection and should not be used as the sole basis for treatment or other patient management decisions.  A negative result may occur with improper specimen collection / handling, submission of specimen other than nasopharyngeal swab, presence of viral mutation(s) within the areas targeted by this assay, and inadequate number of viral copies (<250 copies / mL). A negative result must be combined with clinical observations, patient history, and epidemiological information.  Fact Sheet for Patients:   BoilerBrush.com.cy  Fact Sheet for Healthcare Providers: https://pope.com/  This test is not yet approved or  cleared by the [place] FDA and has been authorized for detection and/or diagnosis of SARS-CoV-2 by FDA under an Emergency Use Authorization (EUA).  This EUA will remain in effect (meaning this test can be used) for the duration of the COVID-19 declaration under Section 564(b)(1) of the Act, 21 U.S.C. section 360bbb-3(b)(1), unless the authorization is terminated or revoked sooner.  Performed at [facility], [address] [place], [place] [address]   Comprehensive metabolic panel      Status: Abnormal   Collection Time: 03/18/20  4:52 PM  Result Value Ref Range   Sodium 144 135 - 145 mmol/L   Potassium 3.3 (L) 3.5 - 5.1 mmol/L   Chloride 106 98 - 111 mmol/L   CO2 23 22 - 32 mmol/L   Glucose, Bld 130 (H) 70 - 99 mg/dL    Comment: Glucose reference range applies only to samples taken after fasting for at least 8 hours.   BUN 11 6 - 20 mg/dL   Creatinine, Ser 5.17 0.44 - 1.00 mg/dL   Calcium 9.9 8.9 - 00.1 mg/dL   Total Protein 7.9 6.5 - 8.1 g/dL   Albumin 4.7 3.5 - 5.0 g/dL   AST 25 15 - 41 U/L   ALT 26 0 - 44 U/L   Alkaline Phosphatase 80 38 - 126 U/L   Total Bilirubin 0.6 0.3 - 1.2 mg/dL   GFR calc non Af Amer >60 >60 mL/min   GFR calc Af Amer >60 >60 mL/min   Anion gap 15 5 - 15    Comment: Performed at [facility], [address] [place], [place] [address]  CBC with Diff     Status: None   Collection Time: 03/18/20  4:52 PM  Result Value Ref Range   WBC 9.5 4.0 - 10.5 K/uL   RBC 5.02 3.87 - 5.11 MIL/uL   Hemoglobin 13.1 12.0 - 15.0 g/dL   HCT 96.7 36 - 46 %   MCV 82.1 80.0 - 100.0 fL   MCH 26.1 26.0 - 34.0 pg   MCHC 31.8 30.0 - 36.0 g/dL   RDW 59.1 63.8 - 46.6 %   Platelets 361 150 - 400 K/uL   nRBC 0.0 0.0 - 0.2 %   Neutrophils Relative % 61 %   Neutro Abs 5.8 1.7 - 7.7 K/uL   Lymphocytes Relative 33 %   Lymphs Abs 3.1 0.7 - 4.0 K/uL   Monocytes Relative 5 %   Monocytes Absolute 0.5 0 - 1 K/uL   Eosinophils Relative 0 %   Eosinophils Absolute 0.0 0 - 0 K/uL   Basophils Relative 0 %   Basophils Absolute 0.0 0 - 0 K/uL   Immature Granulocytes 1 %   Abs Immature Granulocytes 0.05 0.00 - 0.07  K/uL    Comment: Performed at [facility], [address], [place], [place] [address]  Salicylate level     Status: Abnormal   Collection Time: 03/18/20  4:52 PM  Result Value Ref Range   Salicylate Lvl <7.0 (L) 7.0 - 30.0 mg/dL    Comment: Performed at [facility], [address]  [place], [place] [address]  Acetaminophen level     Status: Abnormal   Collection Time: 03/18/20  4:52 PM  Result Value Ref Range   Acetaminophen (Tylenol), Serum <10 (L) 10 - 30 ug/mL    Comment: (NOTE) Therapeutic concentrations vary significantly. A range of 10-30 ug/mL  may be an effective concentration for many patients. However, some  are best treated at concentrations outside of this range. Acetaminophen concentrations >150 ug/mL at 4 hours after ingestion  and >50 ug/mL at 12 hours after ingestion are often associated with  toxic reactions.  Performed at [facility], [address] [place], [place] [address]   Ethanol     Status: None   Collection Time: 03/18/20  4:52 PM  Result Value Ref Range   Alcohol, Ethyl (B) <10 <10 mg/dL    Comment: (NOTE) Lowest detectable limit for serum alcohol is 10 mg/dL.  For medical purposes only. Performed at [facility], [address] [place], [place] [address]   I-Stat beta hCG blood, ED     Status: None   Collection Time: 03/18/20  4:55 PM  Result Value Ref Range   I-stat hCG, quantitative <5.0 <5 mIU/mL   Comment 3            Comment:   GEST. AGE      CONC.  (mIU/mL)   <=1 WEEK        5 - 50     2 WEEKS       50 - 500     3 WEEKS       100 - 10,000     4 WEEKS     1,000 - 30,000        FEMALE AND NON-PREGNANT FEMALE:     LESS THAN 5 mIU/mL     Blood Alcohol level:  Lab Results  Component Value Date   ETH <10 03/18/2020    Metabolic Disorder Labs:  No results found for: HGBA1C, MPG No results found for: PROLACTIN No results found for: CHOL, TRIG, HDL, CHOLHDL, VLDL, LDLCALC  Current Medications: Current Facility-Administered Medications  Medication Dose Route Frequency Provider Last Rate Last Admin  . acetaminophen (TYLENOL) tablet 650 mg  650 mg Oral Q6H PRN Jackelyn Poling, NP      . alum & mag hydroxide-simeth (MAALOX/MYLANTA) 200-200-20 MG/5ML suspension 30 mL  30 mL Oral Q4H  PRN Nira Conn A, NP      . hydrOXYzine (ATARAX/VISTARIL) tablet 25 mg  25 mg Oral TID PRN Nira Conn A, NP      . OLANZapine zydis (ZYPREXA) disintegrating tablet 10 mg  10 mg Oral Q8H PRN Antonieta Pert, MD       And  . LORazepam (ATIVAN) tablet 1 mg  1 mg Oral PRN Antonieta Pert, MD       And  . ziprasidone (GEODON) injection 20 mg  20 mg Intramuscular PRN Antonieta Pert, MD      . magnesium hydroxide (MILK OF MAGNESIA) suspension 30 mL  30 mL Oral Daily PRN Jackelyn Poling, NP      .  OLANZapine zydis (ZYPREXA) disintegrating tablet 10 mg  10 mg Oral BID Antonieta Pertlary, Marcellina Jonsson Lawson, MD   10 mg at 03/19/20 0948  . traZODone (DESYREL) tablet 50 mg  50 mg Oral QHS PRN Nira ConnBerry, Jason A, NP       PTA Medications: No medications prior to admission.    Musculoskeletal: Strength & Muscle Tone: within normal limits Gait & Station: normal Patient leans: N/A  Psychiatric Specialty Exam: Physical Exam Vitals and nursing note reviewed.  HENT:     Head: Normocephalic and atraumatic.  Pulmonary:     Effort: Pulmonary effort is normal.  Neurological:     General: No focal deficit present.     Mental Status: She is alert and oriented to person, place, and time.     Review of Systems  Blood pressure (!) 155/93, pulse 80, temperature 98.4 F (36.9 C), temperature source Oral, resp. rate 18, last menstrual period 05/05/2015, SpO2 100 %.There is no height or weight on file to calculate BMI.  General Appearance: Disheveled  Eye Contact:  Good  Speech:  Pressured  Volume:  Increased  Mood:  Dysphoric and Irritable  Affect:  Labile  Thought Process:  Goal Directed and Descriptions of Associations: Tangential  Orientation:  Full (Time, Place, and Person)  Thought Content:  Delusions, Paranoid Ideation, Rumination and Tangential  Suicidal Thoughts:  No  Homicidal Thoughts:  No  Memory:  Immediate;   Good Recent;   Fair Remote;   Fair  Judgement:  Impaired  Insight:  Lacking   Psychomotor Activity:  Increased  Concentration:  Concentration: Poor  Recall:  Good  Fund of Knowledge:  Good  Language:  Good  Akathisia:  Negative  Handed:  Right  AIMS (if indicated):     Assets:  Desire for Improvement Resilience  ADL's:  Intact  Cognition:  WNL  Sleep:  Number of Hours: 0    Treatment Plan Summary: Daily contact with patient to assess and evaluate symptoms and progress in treatment, Medication management and Plan : Patient is seen and examined.  Patient is a 54 year old female with the above-stated past psychiatric history was transferred to our facility for evaluation and stabilization.  She will be admitted to the hospital.  She will be integrated in the milieu.  She will be encouraged to attend groups.  It does appear that she has bipolar disorder.  She denies any previous psychiatric history.  She has already refused medication, and I asked believe she will most likely need forced medications.  I have already written for the agitation protocol.  We will use the Zyprexa agitation protocol.  We will attempt to give her Zyprexa 10 mg p.o. twice daily and titrate that during the course of the hospitalization.  We will contact her son or family members for collateral information.  Review of the electronic medical record was unable to disclose any previous psychiatric treatment or medications that were psychiatric in origin.  Review of her admission laboratories revealed a mildly low potassium at 3.3.  A mildly elevated glucose at 130.  The rest of her electrolytes including liver function enzymes were normal.  CBC was normal.  Differential was normal.  Acetaminophen was less than 10, salicylate less than 7.  Beta-hCG was less than 5.  Blood alcohol was less than 10.  Drug screen was completely negative.  X-rays in the emergency room of her left shoulder were negative.  X-rays of her wrist were negative.  She had a mammogram done on 9/3 that  was negative.  TSH was ordered this  morning.  Observation Level/Precautions:  15 minute checks  Laboratory:  Chemistry Profile  Psychotherapy:    Medications:    Consultations:    Discharge Concerns:    Estimated LOS:  Other:     Physician Treatment Plan for Primary Diagnosis: <principal problem not specified> Long Term Goal(s): Improvement in symptoms so as ready for discharge  Short Term Goals: Ability to identify changes in lifestyle to reduce recurrence of condition will improve, Ability to verbalize feelings will improve, Ability to disclose and discuss suicidal ideas, Ability to demonstrate self-control will improve, Ability to identify and develop effective coping behaviors will improve and Ability to maintain clinical measurements within normal limits will improve  Physician Treatment Plan for Secondary Diagnosis: Active Problems:   Brief psychotic disorder (HCC)  Long Term Goal(s): Improvement in symptoms so as ready for discharge  Short Term Goals: Ability to identify changes in lifestyle to reduce recurrence of condition will improve, Ability to verbalize feelings will improve, Ability to disclose and discuss suicidal ideas, Ability to demonstrate self-control will improve, Ability to identify and develop effective coping behaviors will improve and Ability to maintain clinical measurements within normal limits will improve  I certify that inpatient services furnished can reasonably be expected to improve the patient's condition.    Antonieta Pert, MD 9/15/202112:12 PM

## 2020-03-20 ENCOUNTER — Inpatient Hospital Stay (HOSPITAL_COMMUNITY): Payer: 59

## 2020-03-20 LAB — LIPID PANEL
Cholesterol: 211 mg/dL — ABNORMAL HIGH (ref 0–200)
HDL: 62 mg/dL (ref >40)
LDL Cholesterol: 116 mg/dL — ABNORMAL HIGH (ref 0–99)
Total CHOL/HDL Ratio: 3.4 RATIO
Triglycerides: 164 mg/dL — ABNORMAL HIGH (ref <150)
VLDL: 33 mg/dL (ref 0–40)

## 2020-03-20 LAB — HEMOGLOBIN A1C
Hgb A1c MFr Bld: 5.9 % — ABNORMAL HIGH (ref 4.8–5.6)
Mean Plasma Glucose: 122.63 mg/dL

## 2020-03-20 LAB — TSH: TSH: 2.279 u[IU]/mL (ref 0.350–4.500)

## 2020-03-20 MED ORDER — OLANZAPINE 10 MG PO TBDP
20.0000 mg | ORAL_TABLET | Freq: Every day | ORAL | Status: DC
  Administered 2020-03-20: 20 mg via ORAL
  Filled 2020-03-20 (×3): qty 2

## 2020-03-20 MED ORDER — OLANZAPINE 5 MG PO TBDP
15.0000 mg | ORAL_TABLET | Freq: Every day | ORAL | Status: DC
  Filled 2020-03-20 (×2): qty 1

## 2020-03-20 MED ORDER — METHOCARBAMOL 500 MG PO TABS
500.0000 mg | ORAL_TABLET | Freq: Four times a day (QID) | ORAL | Status: DC | PRN
  Administered 2020-03-20 – 2020-03-21 (×2): 500 mg via ORAL
  Filled 2020-03-20 (×2): qty 1

## 2020-03-20 MED ORDER — HYDROXYZINE HCL 50 MG PO TABS: 50.0000 mg | ORAL_TABLET | Freq: Three times a day (TID) | ORAL | Status: DC | PRN

## 2020-03-20 MED ORDER — TRAZODONE HCL 100 MG PO TABS: 100.0000 mg | ORAL_TABLET | Freq: Every evening | ORAL | Status: DC | PRN

## 2020-03-20 NOTE — Progress Notes (Signed)
Pt has been sleeping since the beginning of the shift, no issues reported at this time. Respirations are even and unlabored, maintained on routine checks, will continue to monitor.

## 2020-03-20 NOTE — Progress Notes (Signed)
Recreation Therapy Notes  Date: 9.16.21 Time: 0950 Location: [address] Dayroom  Group Topic: Coping Skills  Goal Area(s) Addresses:  Patient will identify positive coping skills. Patient will identify benefit of using coping skills post d/c.  Intervention: Worksheet, Music  Activity: Coping Skills A to Z.  Patients listened to music in the background as they attempted to identify coping skills for each letter of the alphabet.  Education: Pharmacologist, Building control surveyor.   Education Outcome: Acknowledges understanding/In group clarification offered/Needs additional education.   Clinical Observations/Feedback: Pt did not attend group.    Caroll Rancher, LRT/CTRS        Caroll Rancher A 03/20/2020 10:49 AM
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
## 2020-03-20 NOTE — Progress Notes (Addendum)
Patient spoke with writer at length about how she ended up in the hospital. She became angry and anxious just talking about it. She feels that she is being held against her will. She at first refused her hs medications and writer encouraged her to take it. She did not want anything to help her rest. She reported that she does not sleep every night and will be fine tonight. She was encouraged to try and be a little kinder with peers and staff. She came to nursing station and requested Robaxin for left shoulder pain which she received. Will monitor effectiveness of medication. Safety maintained on unit with 15 min checks.

## 2020-03-20 NOTE — Progress Notes (Signed)
The patient refused to attend group this evening.
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
## 2020-03-20 NOTE — Progress Notes (Signed)
[facility] MD Progress Note  03/20/2020 11:12 AM Cathy KubaWanda Santana  MRN:  [id number] Subjective: Patient is a 54 year old female with a negative past psychiatric history who [facility] [place] [facility] [place] [facility] [place] after she had fired a weapon and was very aggressive with police.  Objective: Patient is seen and examined.  Patient is a 54 year old female with the above stated past psychiatric history who was admitted for suspected bipolar disorder.  She is seen in follow-up.  She remains pressured, tangential and irritable.  I was able to make contact with her son last night.  Her son stated that he has a history of bipolar disorder, and that he is suspected her of having the same but she denied that.  Today she remains irritable.  She stated that I have prevented her from seeing at least 5 clients, and she is lost money.  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  She stated the events that led to her firing her weapon and having the police been called were just because she wanted to fire the weapon.  It had something to do with a gas can and her family members.  She admitted to spending excessive amounts of money, but stated "I am rich and I am brilliant".  The son denied that she had ever seen a psychiatrist denied any substance issues, and denied any previous treatment at least that he was aware of.  She denied suicidal or homicidal ideation.  She denied any auditory or visual hallucinations.  She is grandiose, tangential, pressured and euphoric as well as irritable.  She has refused additional laboratories including a thyroid function study to rule out biological origins to her behavior.  Her vital signs are stable, she is afebrile.  She slept 5.5 hours last night after she received Zyprexa to 15 mg.  Principal Problem: <principal problem not specified> Diagnosis: Active Problems:   Brief psychotic disorder (HCC)  Total Time spent with  patient: 20 minutes  Past Psychiatric History: See admission H&P  Past Medical History:  Past Medical History:  Diagnosis Date  . GI bleed   . Hypercholesteremia     Past Surgical History:  Procedure Laterality Date  . BUNIONECTOMY  2009   bilateral 4 weeks apart  . CESAREAN SECTION     '96  . TUBAL LIGATION     '08   Family History:  Family History  Problem Relation Age of Onset  . Stroke Father   . Stroke Sister   . Breast cancer Paternal Grandmother        unsure of age   Family Psychiatric  History: See admission H&P Social History:  Social History   Substance and Sexual Activity  Alcohol Use No     Social History   Substance and Sexual Activity  Drug Use No    Social History   Socioeconomic History  . Marital status: Single    Spouse name: Not on file  . Number of children: Not on file  . Years of education: Not on file  . Highest education level: Not on file  Occupational History  . Not on file  Tobacco Use  . Smoking status: Never Smoker  . Smokeless tobacco: Never Used  Vaping Use  . Vaping Use: Never used  Substance and Sexual Activity  . Alcohol use: No  . Drug use: No  . Sexual activity: Not on file  Other Topics Concern  . Not  on file  Social History Narrative  . Not on file   Social Determinants of Health   Financial Resource Strain:   . Difficulty of Paying Living Expenses: Not on file  Food Insecurity:   . Worried About Programme researcher, broadcasting/film/video in the Last Year: Not on file  . Ran Out of Food in the Last Year: Not on file  Transportation Needs:   . Lack of Transportation (Medical): Not on file  . Lack of Transportation (Non-Medical): Not on file  Physical Activity:   . Days of Exercise per Week: Not on file  . Minutes of Exercise per Session: Not on file  Stress:   . Feeling of Stress : Not on file  Social Connections:   . Frequency of Communication with Friends and Family: Not on file  . Frequency of Social Gatherings with  Friends and Family: Not on file  . Attends Religious Services: Not on file  . Active Member of Clubs or Organizations: Not on file  . Attends Banker Meetings: Not on file  . Marital Status: Not on file   Additional Social History:                         Sleep: Fair  Appetite:  Fair  Current Medications: Current Facility-Administered Medications  Medication Dose Route Frequency Provider Last Rate Last Admin  . acetaminophen (TYLENOL) tablet 650 mg  650 mg Oral Q6H PRN Nira Conn A, NP      . alum & mag hydroxide-simeth (MAALOX/MYLANTA) 200-200-20 MG/5ML suspension 30 mL  30 mL Oral Q4H PRN Nira Conn A, NP      . diphenhydrAMINE (BENADRYL) injection 25 mg  25 mg Intramuscular Q6H PRN Antonieta Pert, MD   25 mg at 03/19/20 1626  . hydrOXYzine (ATARAX/VISTARIL) tablet 25 mg  25 mg Oral TID PRN Jackelyn Poling, NP      . LORazepam (ATIVAN) injection 2 mg  2 mg Intramuscular Q6H PRN Antonieta Pert, MD   2 mg at 03/19/20 1627  . OLANZapine zydis (ZYPREXA) disintegrating tablet 10 mg  10 mg Oral Q8H PRN Antonieta Pert, MD       And  . LORazepam (ATIVAN) tablet 1 mg  1 mg Oral PRN Antonieta Pert, MD       And  . ziprasidone (GEODON) injection 20 mg  20 mg Intramuscular PRN Antonieta Pert, MD      . magnesium hydroxide (MILK OF MAGNESIA) suspension 30 mL  30 mL Oral Daily PRN Jackelyn Poling, NP      . Melene Muller ON 03/21/2020] OLANZapine zydis (ZYPREXA) disintegrating tablet 15 mg  15 mg Oral Daily Antonieta Pert, MD      . OLANZapine zydis (ZYPREXA) disintegrating tablet 20 mg  20 mg Oral QHS Antonieta Pert, MD      . traZODone (DESYREL) tablet 100 mg  100 mg Oral QHS PRN Antonieta Pert, MD        Lab Results:  Results for orders placed or performed during the hospital encounter of 03/18/20 (from the past 48 hour(s))  Urine rapid drug screen (hosp performed)     Status: None   Collection Time: 03/18/20  4:46 PM  Result Value Ref Range    Opiates NONE DETECTED NONE DETECTED   Cocaine NONE DETECTED NONE DETECTED   Benzodiazepines NONE DETECTED NONE DETECTED   Amphetamines NONE DETECTED NONE DETECTED   Tetrahydrocannabinol NONE DETECTED  NONE DETECTED   Barbiturates NONE DETECTED NONE DETECTED    Comment: (NOTE) DRUG SCREEN FOR MEDICAL PURPOSES ONLY.  IF CONFIRMATION IS NEEDED FOR ANY PURPOSE, NOTIFY LAB WITHIN 5 DAYS.  LOWEST DETECTABLE LIMITS FOR URINE DRUG SCREEN Drug Class                     Cutoff (ng/mL) Amphetamine and metabolites    1000 Barbiturate and metabolites    200 Benzodiazepine                 200 Tricyclics and metabolites     300 Opiates and metabolites        300 Cocaine and metabolites        300 THC                            50 Performed at [facility], [address], [place], [place] [address]   SARS Coronavirus 2 by RT PCR (hospital order, performed in [facility] hospital lab) Nasopharyngeal Nasopharyngeal Swab     Status: None   Collection Time: 03/18/20  4:52 PM   Specimen: Nasopharyngeal Swab  Result Value Ref Range   SARS Coronavirus 2 NEGATIVE NEGATIVE    Comment: (NOTE) SARS-CoV-2 target nucleic acids are NOT DETECTED.  The SARS-CoV-2 RNA is generally detectable in upper and lower respiratory specimens during the acute phase of infection. The lowest concentration of SARS-CoV-2 viral copies this assay can detect is 250 copies / mL. A negative result does not preclude SARS-CoV-2 infection and should not be used as the sole basis for treatment or other patient management decisions.  A negative result may occur with improper specimen collection / handling, submission of specimen other than nasopharyngeal swab, presence of viral mutation(s) within the areas targeted by this assay, and inadequate number of viral copies (<250 copies / mL). A negative result must be combined with clinical observations, patient history, and epidemiological information.  Fact  Sheet for Patients:   BoilerBrush.com.cy  Fact Sheet for Healthcare Providers: https://pope.com/  This test is not yet approved or  cleared by the [place] FDA and has been authorized for detection and/or diagnosis of SARS-CoV-2 by FDA under an Emergency Use Authorization (EUA).  This EUA will remain in effect (meaning this test can be used) for the duration of the COVID-19 declaration under Section 564(b)(1) of the Act, 21 U.S.C. section 360bbb-3(b)(1), unless the authorization is terminated or revoked sooner.  Performed at [facility], [address], [place], [place] [address]   Comprehensive metabolic panel     Status: Abnormal   Collection Time: 03/18/20  4:52 PM  Result Value Ref Range   Sodium 144 135 - 145 mmol/L   Potassium 3.3 (L) 3.5 - 5.1 mmol/L   Chloride 106 98 - 111 mmol/L   CO2 23 22 - 32 mmol/L   Glucose, Bld 130 (H) 70 - 99 mg/dL    Comment: Glucose reference range applies only to samples taken after fasting for at least 8 hours.   BUN 11 6 - 20 mg/dL   Creatinine, Ser 3.08 0.44 - 1.00 mg/dL   Calcium 9.9 8.9 - 65.7 mg/dL   Total Protein 7.9 6.5 - 8.1 g/dL   Albumin 4.7 3.5 - 5.0 g/dL   AST 25 15 - 41 U/L   ALT 26 0 - 44 U/L   Alkaline Phosphatase 80 38 - 126 U/L  Total Bilirubin 0.6 0.3 - 1.2 mg/dL   GFR calc non Af Amer >60 >60 mL/min   GFR calc Af Amer >60 >60 mL/min   Anion gap 15 5 - 15    Comment: Performed at [facility], [address], [place], [place] [address]  CBC with Diff     Status: None   Collection Time: 03/18/20  4:52 PM  Result Value Ref Range   WBC 9.5 4.0 - 10.5 K/uL   RBC 5.02 3.87 - 5.11 MIL/uL   Hemoglobin 13.1 12.0 - 15.0 g/dL   HCT 82.9 36 - 46 %   MCV 82.1 80.0 - 100.0 fL   MCH 26.1 26.0 - 34.0 pg   MCHC 31.8 30.0 - 36.0 g/dL   RDW 56.2 13.0 - 86.5 %   Platelets 361 150 - 400 K/uL   nRBC 0.0 0.0 - 0.2 %   Neutrophils Relative %  61 %   Neutro Abs 5.8 1.7 - 7.7 K/uL   Lymphocytes Relative 33 %   Lymphs Abs 3.1 0.7 - 4.0 K/uL   Monocytes Relative 5 %   Monocytes Absolute 0.5 0 - 1 K/uL   Eosinophils Relative 0 %   Eosinophils Absolute 0.0 0 - 0 K/uL   Basophils Relative 0 %   Basophils Absolute 0.0 0 - 0 K/uL   Immature Granulocytes 1 %   Abs Immature Granulocytes 0.05 0.00 - 0.07 K/uL    Comment: Performed at [facility], [address] [place], [place] [address]  Salicylate level     Status: Abnormal   Collection Time: 03/18/20  4:52 PM  Result Value Ref Range   Salicylate Lvl <7.0 (L) 7.0 - 30.0 mg/dL    Comment: Performed at [facility], [address] [place], [place] [address]  Acetaminophen level     Status: Abnormal   Collection Time: 03/18/20  4:52 PM  Result Value Ref Range   Acetaminophen (Tylenol), Serum <10 (L) 10 - 30 ug/mL    Comment: (NOTE) Therapeutic concentrations vary significantly. A range of 10-30 ug/mL  may be an effective concentration for many patients. However, some  are best treated at concentrations outside of this range. Acetaminophen concentrations >150 ug/mL at 4 hours after ingestion  and >50 ug/mL at 12 hours after ingestion are often associated with  toxic reactions.  Performed at [facility], [address], [place], [place] [address]   Ethanol     Status: None   Collection Time: 03/18/20  4:52 PM  Result Value Ref Range   Alcohol, Ethyl (B) <10 <10 mg/dL    Comment: (NOTE) Lowest detectable limit for serum alcohol is 10 mg/dL.  For medical purposes only. Performed at [facility], [address], [place], [place] [address]   I-Stat beta hCG blood, ED     Status: None   Collection Time: 03/18/20  4:55 PM  Result Value Ref Range   I-stat hCG, quantitative <5.0 <5 mIU/mL   Comment 3            Comment:   GEST. AGE      CONC.  (mIU/mL)   <=1 WEEK        5 - 50     2 WEEKS       50  - 500     3 WEEKS       100 - 10,000     4 WEEKS     1,000 -  30,000        FEMALE AND NON-PREGNANT FEMALE:     LESS THAN 5 mIU/mL     Blood Alcohol level:  Lab Results  Component Value Date   ETH <10 03/18/2020    Metabolic Disorder Labs: No results found for: HGBA1C, MPG No results found for: PROLACTIN No results found for: CHOL, TRIG, HDL, CHOLHDL, VLDL, LDLCALC  Physical Findings: AIMS: Facial and Oral Movements Muscles of Facial Expression: None, normal Lips and Perioral Area: None, normal Jaw: None, normal Tongue: None, normal,Extremity Movements Upper (arms, wrists, hands, fingers): None, normal Lower (legs, knees, ankles, toes): None, normal, Trunk Movements Neck, shoulders, hips: None, normal, Overall Severity Severity of abnormal movements (highest score from questions above): None, normal Incapacitation due to abnormal movements: None, normal Patient's awareness of abnormal movements (rate only patient's report): No Awareness, Dental Status Current problems with teeth and/or dentures?: No Does patient usually wear dentures?: No  CIWA:    COWS:     Musculoskeletal: Strength & Muscle Tone: within normal limits Gait & Station: normal Patient leans: N/A  Psychiatric Specialty Exam: Physical Exam Vitals and nursing note reviewed.  Constitutional:      Appearance: Normal appearance.  HENT:     Head: Normocephalic and atraumatic.  Pulmonary:     Effort: Pulmonary effort is normal.  Neurological:     General: No focal deficit present.     Mental Status: She is alert.     Review of Systems  Blood pressure (P) 104/78, pulse (P) 67, temperature (P) 98.4 F (36.9 C), temperature source (P) Oral, resp. rate (P) 16, last menstrual period 05/05/2015, SpO2 (P) 100 %.There is no height or weight on file to calculate BMI.  General Appearance: Casual  Eye Contact:  Good  Speech:  Pressured  Volume:  Increased  Mood:  Dysphoric and Irritable  Affect:  Labile   Thought Process:  Coherent and Descriptions of Associations: Tangential  Orientation:  Full (Time, Place, and Person)  Thought Content:  Delusions, Rumination and Tangential  Suicidal Thoughts:  No  Homicidal Thoughts:  No  Memory:  Immediate;   Fair Recent;   Fair Remote;   Fair  Judgement:  Impaired  Insight:  Lacking  Psychomotor Activity:  Increased  Concentration:  Concentration: Poor and Attention Span: Poor  Recall:  Fair  Fund of Knowledge:  Good  Language:  Good  Akathisia:  Negative  Handed:  Right  AIMS (if indicated):     Assets:  Desire for Improvement Resilience  ADL's:  Intact  Cognition:  WNL  Sleep:  Number of Hours: 5.5     Treatment Plan Summary: Daily contact with patient to assess and evaluate symptoms and progress in treatment, Medication management and Plan : Patient is seen and examined.  Patient is a 54 year old female with the above-stated past psychiatric history who is seen in follow-up.   Diagnosis: 1.  Bipolar disorder, most recently manic, severe with psychotic features  Pertinent findings on examination today: 1.  Patient remains tangential, pressured, agitated, irritable and expansive.  Plan: 1.  Increase Zyprexa to 15 mg p.o. daily and 20 mg p.o. nightly for mania. 2.  Reorder TSH, hemoglobin A1c, lipid panel. 3.  Attempt to get EKG again to assess rhythm. 4.  Continue hydroxyzine but increase to 50 mg p.o. 3 times daily as needed anxiety. 5.  Continue Zyprexa Zydis/lorazepam/Geodon as needed for significant agitation. 6.  Continue trazodone 100 mg p.o. nightly as needed insomnia. 7.  Collateral information  from patient's mother regarding use of weapon. 8.  Disposition planning-in progress.  Antonieta Pert, MD 03/20/2020, 11:12 AM

## 2020-03-20 NOTE — Progress Notes (Signed)
   03/20/20 0750  Vital Signs  Temp 98.4 F (36.9 C)  Temp Source Oral  Pulse Rate 67  Pulse Rate Source Dinamap  Resp 16  BP 104/78  Oxygen Therapy  SpO2 100 %  Pain Assessment  Pain Scale 0-10  Pain Score 0   D: Patient denies SI/HI/AVH. Patient denies anxiety and depression. Patient isolates in room. 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 Patient only wanted her blood pressure taken with a disposable cuff. A:  Patient took scheduled medicine.  Support and encouragement provided Routine safety checks conducted every 15 minutes. Patient  Informed to notify staff with any concerns.   R: Safety maintained.

## 2020-03-21 DIAGNOSIS — F23 Brief psychotic disorder: Secondary | ICD-10-CM

## 2020-03-21 LAB — PROLACTIN: Prolactin: 25.9 ng/mL — ABNORMAL HIGH (ref 4.8–23.3)

## 2020-03-21 MED ORDER — OLANZAPINE 15 MG PO TBDP: 15.0000 mg | ORAL_TABLET | Freq: Every day | ORAL | 0 refills | Status: AC

## 2020-03-21 MED ORDER — HYDROXYZINE HCL 50 MG PO TABS
50.0000 mg | ORAL_TABLET | Freq: Three times a day (TID) | ORAL | 0 refills | Status: AC | PRN
Start: 2020-03-21 — End: ?

## 2020-03-21 MED ORDER — TRAZODONE HCL 100 MG PO TABS
100.0000 mg | ORAL_TABLET | Freq: Every evening | ORAL | 0 refills | Status: AC | PRN
Start: 2020-03-21 — End: ?

## 2020-03-21 MED ORDER — OLANZAPINE 20 MG PO TBDP: 20.0000 mg | ORAL_TABLET | Freq: Every day | ORAL | 0 refills | Status: AC

## 2020-03-21 NOTE — Discharge Summary (Signed)
Physician Discharge Summary Note  Patient:  Cathy Santana is an 54 y.o., female  MRN:  [id number]  DOB:  [birth year]  Patient phone:  [phone] (home)   Patient address:   [place] [address],   Total Time spent with patient: Greater than 30 minutes  Date of Admission:  03/19/2020  Date of Discharge: 03-22-15  Reason for Admission: Aggressive behavior, had a hand gun threatening to kill people (specificcaly her son)  Principal Problem: Brief psychotic disorder [facility])  Discharge Diagnoses: Principal Problem:   Brief psychotic disorder [facility])  Past Psychiatric History: Brief psychotic disorder  Past Medical History:  Past Medical History:  Diagnosis Date  . GI bleed   . Hypercholesteremia     Past Surgical History:  Procedure Laterality Date  . BUNIONECTOMY  2009   bilateral 4 weeks apart  . CESAREAN SECTION     '96  . TUBAL LIGATION     '08   Family History:  Family History  Problem Relation Age of Onset  . Stroke Father   . Stroke Sister   . Breast cancer Paternal Grandmother        unsure of age   Family Psychiatric  History: Per H&P  Social History:  Social History   Substance and Sexual Activity  Alcohol Use No     Social History   Substance and Sexual Activity  Drug Use No    Social History   Socioeconomic History  . Marital status: Single    Spouse name: Not on file  . Number of children: Not on file  . Years of education: Not on file  . Highest education level: Not on file  Occupational History  . Not on file  Tobacco Use  . Smoking status: Never Smoker  . Smokeless tobacco: Never Used  Vaping Use  . Vaping Use: Never used  Substance and Sexual Activity  . Alcohol use: No  . Drug use: No  . Sexual activity: Not on file  Other Topics Concern  . Not on file  Social History Narrative  . Not on file   Social Determinants of Health   Financial Resource Strain:   . Difficulty of Paying Living Expenses:  Not on file  Food Insecurity:   . Worried About Charity fundraiser in the Last Year: Not on file  . Ran Out of Food in the Last Year: Not on file  Transportation Needs:   . Lack of Transportation (Medical): Not on file  . Lack of Transportation (Non-Medical): Not on file  Physical Activity:   . Days of Exercise per Week: Not on file  . Minutes of Exercise per Session: Not on file  Stress:   . Feeling of Stress : Not on file  Social Connections:   . Frequency of Communication with Friends and Family: Not on file  . Frequency of Social Gatherings with Friends and Family: Not on file  . Attends Religious Services: Not on file  . Active Member of Clubs or Organizations: Not on file  . Attends Archivist Meetings: Not on file  . Marital Status: Not on file   Hospital Course: (Per Md's admission evaluation notes): Patient is seen and examined. Patient is a 54 year old female which according to the patient has a negative past psychiatric history who was placed under involuntary commitment by [facility] police on 01/27/3663. Per the report the patient had been very aggressive. She apparently had a handgun and was threatening to shoot people. Specifically  she was going to shoot her son. She was going to find her son and shooting. She pulled out the gun and discharged today on the ground. Per police neighbor confirmed a weapon was discharged. She was then picked up by police. During the assessment in the [facility] emergency department she was angry, threatening. She is very disorganized. She was very labile. She would become tearful. She was transferred to our facility for continued evaluation and stabilization. On examination this morning she has been on the phone at least 10 times since I got here, very pressured, very tangential, very agitated. She denied any history of bipolar disorder. She denied any other psychiatric history. She denied ever previously  been psychiatrically admitted or psychiatrically treated. She was admitted to the hospital for evaluation and stabilization.  This is the first admission/discharge summary from [facility] for this 54 year old female with a negative hx of mental illness until now.  She was admitted to the [facility] under IVC for mental health evaluation due to agreesive behavior, holding a gun & threatening to shoot other people specifically her son.   After her admission evaluation, patient's presenting symptoms were noted. She was recommended for mood stabilization treatments. The medication regimen targeting those presenting symptoms were discussed with her & initiated with her consent. She was medicated, stabilized & discharged on the medications as listed on her discharge medication lists below. Besides the mood stabilization treatments, Cathy Santana was also enrolled & participated some in the group counseling sessions being offered & held on this unit. She learned some coping skills. She presented no other significant pre-existing medical issues that required treatment. She tolerated her treatment regimen without any adverse effects or reactions reported.  Cathy Santana's symptoms responded well to her treatment regimen. Her symptoms has subsided & mood is now stable. Patient has met the maximum benefit of her hospitalization. She is currently mentally & medically stable to continue mental health care & medication management on an outpatient basis as noted below. She is provided with all the necessary information needed to make this appointment without problems.   During the course of her hospitalization, the 15-minute checks were adequate to ensure Cathy Santana's safety.  Patient did display some behavioral issues while on the unit & was placed on unit restrictions as a result. She interacted some with the other patients & staff. She participated some in the group sessions/therapies to learn coping skills. Her medications were addressed & adjusted to  meet her needs. She was recommended for outpatient follow-up care & medication management upon discharge to assure continuity of care.  At the time of discharge, patient is not reporting any acute suicidal/homicidal ideations. She feels more confident about her self-care & in managing her mental health moving forward. She currently denies any new issues or concerns. Education and supportive counseling provided throughout her hospital stay & upon discharge.  Today upon her discharge evaluation with the attending psychiatrist, Aily shares she is doing well. She denies any other specific concerns. She is sleeping well. Her appetite is good. She denies other physical complaints. She denies AH/VH, delusional thoughts or paranoia. She feels that her medications have been helpful & is in agreement to continue her current treatment regimen. She was able to engage in safety planning including plan to return to [facility] or contact emergency services if she feels unable to maintain her own safety or the safety of others. Pt had no further questions, comments, or concerns. She left [facility] with all personal belongings in  no apparent distress. Transportation per her friend.  Physical Findings: AIMS: Facial and Oral Movements Muscles of Facial Expression: None, normal Lips and Perioral Area: None, normal Jaw: None, normal Tongue: None, normal,Extremity Movements Upper (arms, wrists, hands, fingers): None, normal Lower (legs, knees, ankles, toes): None, normal, Trunk Movements Neck, shoulders, hips: None, normal, Overall Severity Severity of abnormal movements (highest score from questions above): None, normal Incapacitation due to abnormal movements: None, normal Patient's awareness of abnormal movements (rate only patient's report): No Awareness, Dental Status Current problems with teeth and/or dentures?: No Does patient usually wear dentures?: No  CIWA:    COWS:     Musculoskeletal: Strength & Muscle Tone: within  normal limits Gait & Station: normal Patient leans: N/A  Psychiatric Specialty Exam: Physical Exam Vitals and nursing note reviewed.  HENT:     Head: Normocephalic.     Nose: Nose normal.     Mouth/Throat:     Pharynx: Oropharynx is clear.  Eyes:     Pupils: Pupils are equal, round, and reactive to light.  Cardiovascular:     Rate and Rhythm: Normal rate.     Pulses: Normal pulses.  Pulmonary:     Effort: Pulmonary effort is normal.  Genitourinary:    Comments: Deferred Musculoskeletal:        General: Normal range of motion.     Cervical back: Normal range of motion.  Skin:    General: Skin is warm and dry.  Neurological:     General: No focal deficit present.     Mental Status: She is alert and oriented to person, place, and time. Mental status is at baseline.     Review of Systems  Constitutional: Negative for chills, diaphoresis and fever.  HENT: Negative for congestion, rhinorrhea, sneezing and sore throat.   Eyes: Negative for itching.  Respiratory: Negative for cough, chest tightness, shortness of breath and wheezing.   Cardiovascular: Negative for chest pain and palpitations.  Gastrointestinal: Negative for diarrhea, nausea and vomiting.  Endocrine: Negative for cold intolerance.  Genitourinary: Negative for difficulty urinating.  Musculoskeletal: Negative for arthralgias and myalgias.  Skin: Negative.   Allergic/Immunologic: Negative for environmental allergies and food allergies.       Allergies: Codeine, NSAIDS  Neurological: Negative for dizziness, tremors, seizures, syncope, facial asymmetry, speech difficulty, weakness, light-headedness, numbness and headaches.  Psychiatric/Behavioral: Positive for dysphoric mood (Stabilized with medication prior to discharge), hallucinations (Hx. of (Stabilized with medication prior to discharge)) and sleep disturbance (Stabilized with medication prior to discharge). Negative for agitation, behavioral problems, confusion,  decreased concentration, self-injury and suicidal ideas. The patient is not nervous/anxious (Stable upon discharge) and is not hyperactive.     Blood pressure 97/63, pulse 89, temperature 98.2 F (36.8 C), temperature source Oral, resp. rate 16, last menstrual period 05/05/2015, SpO2 100 %.There is no height or weight on file to calculate BMI.  See Md's discharge SRA  Sleep:  Number of Hours: 6   Has this patient used any form of tobacco in the last 30 days? (Cigarettes, Smokeless Tobacco, Cigars, and/or Pipes): N/A  Blood Alcohol level:  Lab Results  Component Value Date   ETH <10 19/41/7408   Metabolic Disorder Labs:  Lab Results  Component Value Date   HGBA1C 5.9 (H) 03/20/2020   MPG 122.63 03/20/2020   Lab Results  Component Value Date   PROLACTIN 25.9 (H) 03/20/2020   Lab Results  Component Value Date   CHOL 211 (H) 03/20/2020   TRIG 164 (H) 03/20/2020  HDL 62 03/20/2020   CHOLHDL 3.4 03/20/2020   VLDL 33 03/20/2020   LDLCALC 116 (H) 03/20/2020   See Psychiatric Specialty Exam and Suicide Risk Assessment completed by Attending Physician prior to discharge.  Discharge destination:  Home  Is patient on multiple antipsychotic therapies at discharge:  No   Has Patient had three or more failed trials of antipsychotic monotherapy by history:  No  Recommended Plan for Multiple Antipsychotic Therapies: NA  Allergies as of 03/21/2020      Reactions   Codeine Nausea And Vomiting   Nsaids    GI bleed       Medication List    TAKE these medications     Indication  hydrOXYzine 50 MG tablet Commonly known as: ATARAX/VISTARIL Take 1 tablet (50 mg total) by mouth 3 (three) times daily as needed for anxiety.  Indication: Feeling Anxious   olanzapine zydis 15 MG disintegrating tablet Commonly known as: ZYPREXA Take 1 tablet (15 mg total) by mouth daily. For mood control  Indication: Mood control   OLANZapine zydis 20 MG disintegrating tablet Commonly known as:  ZYPREXA Take 1 tablet (20 mg total) by mouth at bedtime. For mood control  Indication: Mood control   traZODone 100 MG tablet Commonly known as: DESYREL Take 1 tablet (100 mg total) by mouth at bedtime as needed for sleep.  Indication: Trouble Sleeping      Follow-up recommendations: Activity:  As tolerated Diet: As recommended by your primary care doctor. Keep all scheduled follow-up appointments as recommended.   Comments: Prescriptions given at discharge.  Patient agreeable to plan.  Given opportunity to ask questions.  Appears to feel comfortable with discharge denies any current suicidal or homicidal thought. Patient is also instructed prior to discharge to: Take all medications as prescribed by his/her mental healthcare provider. Report any adverse effects and or reactions from the medicines to his/her outpatient provider promptly. Patient has been instructed & cautioned: To not engage in alcohol and or illegal drug use while on prescription medicines. In the event of worsening symptoms, patient is instructed to call the crisis hotline, 911 and or go to the nearest ED for appropriate evaluation and treatment of symptoms. To follow-up with his/her primary care provider for your other medical issues, concerns and or health care needs.  Signed: Lindell Spar, NP, PMHNP, FNP-BC 03/21/2020, 9:39 AM

## 2020-03-21 NOTE — Progress Notes (Signed)
  [facility] Adult Case Management Discharge Plan :  Will you be returning to the same living situation after discharge:  Yes,  to home At discharge, do you have transportation home?: Yes,  friend to pick this patient up Do you have the ability to pay for your medications: Yes,  has insurance   Release of information consent forms completed and in the chart;  Patient's signature needed at discharge.  Patient to Follow up at:   Next level of care provider has access to [facility] Link:no  Safety Planning and Suicide Prevention discussed: Yes,  with friend     Has patient been referred to the Quitline?: Patient refused referral  Patient has been referred for addiction treatment: Pt. refused referral  Otelia Santee, LCSW 03/21/2020, 10:18 AM

## 2020-03-21 NOTE — [facility] Suicide Risk Assessment (Signed)
[facility] Discharge Suicide Risk Assessment   Principal Problem: <principal problem not specified> Discharge Diagnoses: Active Problems:   Brief psychotic disorder (HCC)   Total Time spent with patient: 15 minutes  Musculoskeletal: Strength & Muscle Tone: within normal limits Gait & Station: normal Patient leans: N/A  Psychiatric Specialty Exam: Review of Systems  Musculoskeletal: Positive for joint swelling.  Psychiatric/Behavioral: Positive for behavioral problems.  All other systems reviewed and are negative.   Blood pressure 97/63, pulse 89, temperature 98.2 F (36.8 C), temperature source Oral, resp. rate 16, last menstrual period 05/05/2015, SpO2 100 %.There is no height or weight on file to calculate BMI.  General Appearance: Casual  Eye Contact::  Good  Speech:  Normal Rate409  Volume:  Increased  Mood:  Irritable  Affect:  Congruent  Thought Process:  Coherent and Descriptions of Associations: Intact  Orientation:  Full (Time, Place, and Person)  Thought Content:  Rumination  Suicidal Thoughts:  No  Homicidal Thoughts:  No  Memory:  Immediate;   Fair Recent;   Fair Remote;   Fair  Judgement:  Intact  Insight:  Lacking  Psychomotor Activity:  Increased  Concentration:  Fair  Recall:  [employer] of Knowledge:Good  Language: Good  Akathisia:  Negative  Handed:  Right  AIMS (if indicated):     Assets:  Desire for Improvement Resilience  Sleep:  Number of Hours: 6  Cognition: WNL  ADL's:  Intact   Mental Status Per Nursing Assessment::   On Admission:  Suicidal ideation indicated by others, Thoughts of violence towards others  Demographic Factors:  Living alone  Loss Factors: Loss of significant relationship  Historical Factors: Impulsivity  Risk Reduction Factors:   NA  Continued Clinical Symptoms:  Bipolar Disorder:   Mixed State  Cognitive Features That Contribute To Risk:  Thought constriction (tunnel vision)    Suicide Risk:  Minimal: No  identifiable suicidal ideation.  Patients presenting with no risk factors but with morbid ruminations; may be classified as minimal risk based on the severity of the depressive symptoms    Plan Of Care/Follow-up recommendations:  Activity:  ad lib  Antonieta Pert, MD 03/21/2020, 8:13 AM

## 2020-03-21 NOTE — [facility] Suicide Risk Assessment (Signed)
[facility] INPATIENT:  Family/Significant Other Suicide Prevention Education  Suicide Prevention Education: Education Completed; Boyfriend, Vergie Living [phone]), has been identified by the patient as the family member/significant other with whom the patient will be residing, and identified as the person(s) who will aid the patient in the event of a mental health crisis (suicidal ideations/suicide attempt).  With written consent from the patient, the family member/significant other has been provided the following suicide prevention education, prior to the and/or following the discharge of the patient.   Request made of family/significant other to:  Remove weapons (e.g., guns, rifles, knives), all items previously/currently identified as safety concern.     The family member/significant other verbalizes understanding of the suicide prevention education information provided.  The family member/significant other agrees to remove the items of safety concern listed above.  CSW spoke with this patients significant other who stated that the gun that this patient had used is no longer in the house. CSW confirmed this with law enforcement. Mr. Cathlean Cower has no safety concerns with this patient discharging at this time.    Ruthann Cancer MSW, LCSW Clincal Social Worker  [facility]

## 2020-03-21 NOTE — Progress Notes (Signed)
Pt discharged to lobby. Pt was stable and appreciative at that time. All papers and prescriptions were given and valuables returned. Verbal understanding expressed. Denies SI/HI and A/VH. Pt given opportunity to express concerns and ask questions.  

## 2020-04-22 ENCOUNTER — Ambulatory Visit
Admission: EM | Admit: 2020-04-22 | Discharge: 2020-04-22 | Disposition: A | Discharge status: Home / Self Care | Payer: 59 | Attending: Emergency Medicine | Admitting: Emergency Medicine

## 2020-04-22 ENCOUNTER — Other Ambulatory Visit: Payer: Self-pay

## 2020-04-22 ENCOUNTER — Encounter: Payer: Self-pay | Admitting: Emergency Medicine

## 2020-04-22 ENCOUNTER — Ambulatory Visit (INDEPENDENT_AMBULATORY_CARE_PROVIDER_SITE_OTHER): Payer: 59

## 2020-04-22 DIAGNOSIS — W19XXXA Unspecified fall, initial encounter: Secondary | ICD-10-CM

## 2020-04-22 DIAGNOSIS — S80212A Abrasion, left knee, initial encounter: Secondary | ICD-10-CM

## 2020-04-22 DIAGNOSIS — M25562 Pain in left knee: Secondary | ICD-10-CM

## 2020-04-22 NOTE — Discharge Instructions (Addendum)

## 2020-04-22 NOTE — ED Triage Notes (Signed)
Pt here with left knee pain after falling on her knee last Wednesday; pt sts increased pain and bruising worse with movement

## 2020-04-22 NOTE — ED Provider Notes (Signed)
[facility]    CSN: [id number] Arrival date & time: 04/22/20  2992      History   Chief Complaint Chief Complaint  Patient presents with  . Knee Pain    HPI Cathy Santana is a 54 y.o. female  Presenting for left knee pain s/p fall on Wednesday.  Pain is worse with movement, able to ambulate.  No deformity.  Has been using ice with some relief.  Requesting x-ray.  Past Medical History:  Diagnosis Date  . GI bleed   . Hypercholesteremia     Patient Active Problem List   Diagnosis Date Noted  . Brief psychotic disorder (HCC) 03/19/2020  . HYPERLIPIDEMIA 11/21/2008  . INSOMNIA, CHRONIC 11/21/2008    Past Surgical History:  Procedure Laterality Date  . BUNIONECTOMY  2009   bilateral 4 weeks apart  . CESAREAN SECTION     '96  . TUBAL LIGATION     '08    OB History   No obstetric history on file.      Home Medications    Prior to Admission medications   Medication Sig Start Date End Date Taking? Authorizing Provider  hydrOXYzine (ATARAX/VISTARIL) 50 MG tablet Take 1 tablet (50 mg total) by mouth 3 (three) times daily as needed for anxiety. 03/21/20   Armandina Stammer I, NP  OLANZapine zydis (ZYPREXA) 15 MG disintegrating tablet Take 1 tablet (15 mg total) by mouth daily. For mood control 03/21/20   Armandina Stammer I, NP  OLANZapine zydis (ZYPREXA) 20 MG disintegrating tablet Take 1 tablet (20 mg total) by mouth at bedtime. For mood control 03/21/20   Armandina Stammer I, NP  traZODone (DESYREL) 100 MG tablet Take 1 tablet (100 mg total) by mouth at bedtime as needed for sleep. 03/21/20   Sanjuana Kava, NP    Family History Family History  Problem Relation Age of Onset  . Stroke Father   . Stroke Sister   . Breast cancer Paternal Grandmother        unsure of age    Social History Social History   Tobacco Use  . Smoking status: Never Smoker  . Smokeless tobacco: Never Used  Vaping Use  . Vaping Use: Never used  Substance Use Topics  . Alcohol use:  No  . Drug use: No     Allergies   Codeine and Nsaids   Review of Systems As per HPI   Physical Exam Triage Vital Signs ED Triage Vitals  Enc Vitals Group     BP      Pulse      Resp      Temp      Temp src      SpO2      Weight      Height      Head Circumference      Peak Flow      Pain Score      Pain Loc      Pain Edu?      Excl. in GC?    No data found.  Updated Vital Signs BP 129/81 (BP Location: Left Arm)   Pulse 69   Temp 98.3 F (36.8 C) (Oral)   Resp 20   LMP 05/05/2015   SpO2 97%   Visual Acuity Right Eye Distance:   Left Eye Distance:   Bilateral Distance:    Right Eye Near:   Left Eye Near:    Bilateral Near:     Physical Exam Constitutional:  General: She is not in acute distress. HENT:     Head: Normocephalic and atraumatic.  Eyes:     General: No scleral icterus.    Pupils: Pupils are equal, round, and reactive to light.  Cardiovascular:     Rate and Rhythm: Normal rate.  Pulmonary:     Effort: Pulmonary effort is normal.  Musculoskeletal:        General: Tenderness present. No swelling. Normal range of motion.     Comments: Left knee with patellar tenderness near abrasion, otherwise unremarkable.  NVI  Skin:    Coloration: Skin is not jaundiced or pale.  Neurological:     Mental Status: She is alert and oriented to person, place, and time.      UC Treatments / Results  Labs (all labs ordered are listed, but only abnormal results are displayed) Labs Reviewed - No data to display  EKG   Radiology DG Knee AP/LAT W/Sunrise Left  Result Date: 04/22/2020 CLINICAL DATA:  Patellar pain EXAM: LEFT KNEE 3 VIEWS COMPARISON:  None. FINDINGS: No evidence of fracture, dislocation, or joint effusion. Unremarkable radiographic appearance of the patella. No evidence of arthropathy or other focal bone abnormality. Soft tissues are unremarkable. IMPRESSION: Negative left knee radiographs. Unremarkable radiographic appearance of  the patella. Electronically Signed   By: Duanne Guess D.O.   On: 04/22/2020 09:08    Procedures Procedures (including critical care time)  Medications Ordered in UC Medications - No data to display  Initial Impression / Assessment and Plan / UC Course  I have reviewed the triage vital signs and the nursing notes.  Pertinent labs & imaging results that were available during my care of the patient were reviewed by me and considered in my medical decision making (see chart for details).     XR negative.  ACE wrap applied & reviewed supportive care as below.  Ortho info provided for f/u PRN.  Return precautions discussed, pt verbalized understanding and is agreeable to plan. Final Clinical Impressions(s) / UC Diagnoses   Final diagnoses:  Acute pain of left knee  Fall, initial encounter  Abrasion of left knee, initial encounter     Discharge Instructions     RICE: rest, ice, compression, elevation as needed for pain.     Pain medication:  350 mg-1000 mg of Tylenol (acetaminophen) and/or 200 mg - 800 mg of Advil (ibuprofen, Motrin) every 8 hours as needed.  May alternate between the two throughout the day as they are generally safe to take together.  DO NOT exceed more than 3000 mg of Tylenol or 3200 mg of ibuprofen in a 24 hour period as this could damage your stomach, kidneys, liver, or increase your bleeding risk.   Important to follow up with specialist(s) below for further evaluation/management if your symptoms persist or worsen.    ED Prescriptions    None     PDMP not reviewed this encounter.   [place], [place], [place] 04/22/20 [phone]

## 2020-06-05 ENCOUNTER — Ambulatory Visit
Admission: RE | Admit: 2020-06-05 | Discharge: 2020-06-05 | Disposition: A | Discharge status: Home / Self Care | Payer: 59 | Source: Ambulatory Visit | Attending: Orthopaedic Surgery | Admitting: Orthopaedic Surgery

## 2020-06-05 ENCOUNTER — Other Ambulatory Visit: Payer: Self-pay | Admitting: Orthopaedic Surgery

## 2020-06-05 DIAGNOSIS — M25562 Pain in left knee: Secondary | ICD-10-CM

## 2020-12-11 IMAGING — MR MR KNEE*L* W/O CM
5 of 7 series · 25 of 40 positions shown · non-contrast
Comparison: Plain films left knee 04/22/2020.

CLINICAL DATA: Left knee pain since a fall 04/17/2020. Subsequent
encounter.

EXAM:
MRI OF THE LEFT KNEE WITHOUT CONTRAST
TECHNIQUE: Multiplanar, multisequence MR imaging of the knee was performed. No
intravenous contrast was administered.

[Series 3: T2 fat-sat · axial · 4.0mm · 0.50mm/px · z∈[-43,+72]mm · 5 of 24 slices shown (1 of 2)]
[im 1/24]
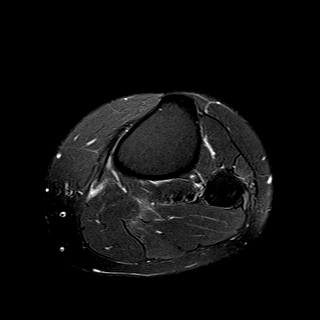
[im 6/24]
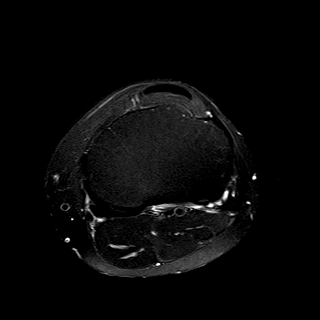
[im 12/24]
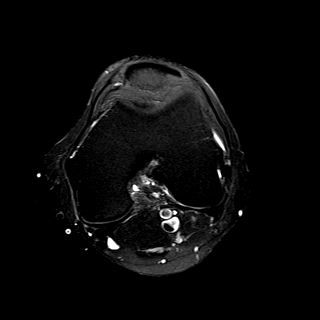
[im 18/24]
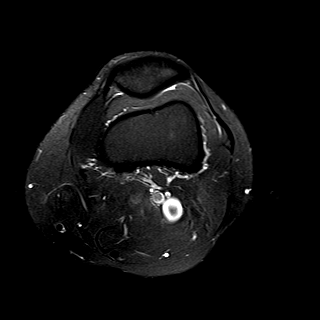
[im 24/24]
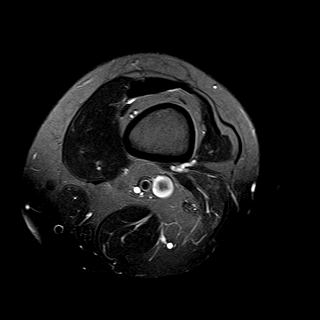

[Series 5: T2 fat-sat · coronal · 4.0mm · 0.29mm/px · 4 of 22 slices shown (2 of 2)]
[im 1/22]
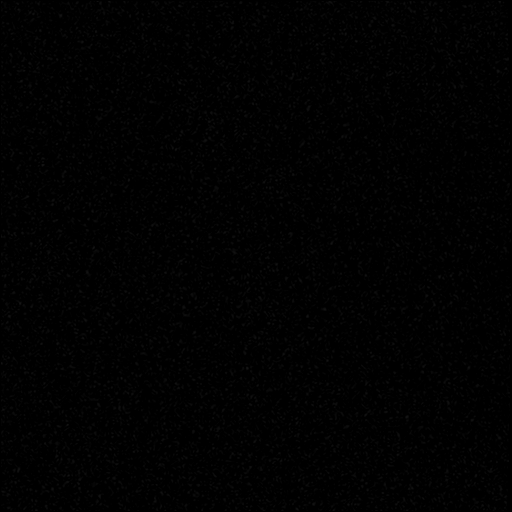
[im 5/22]
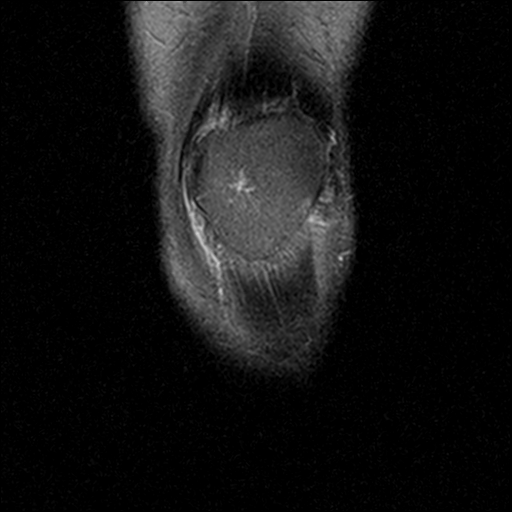
[im 9/22]
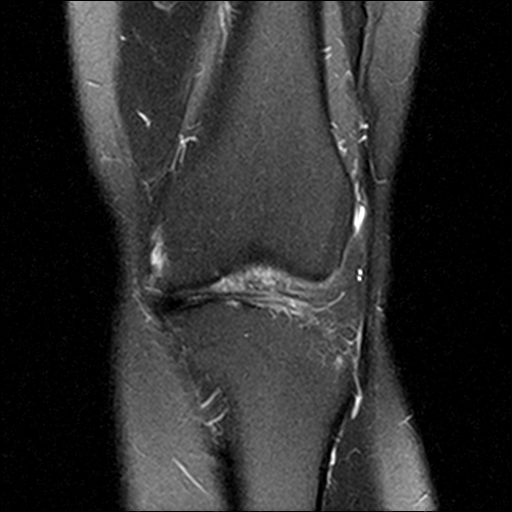
[im 13/22]
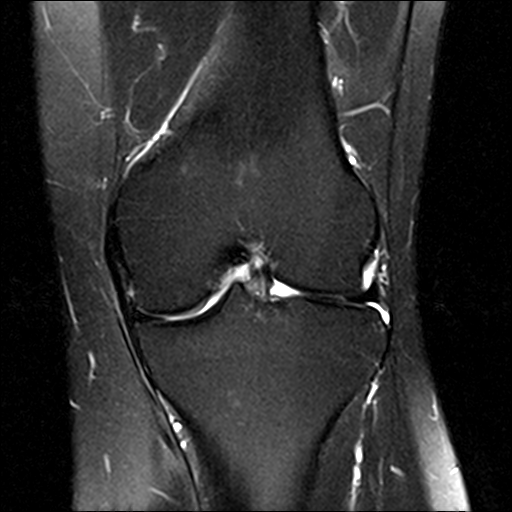

[Series 7: PD fat-sat · sagittal · 3.0mm · 0.29mm/px · 7 of 27 slices shown (1 of 3)]
[im 1/27]
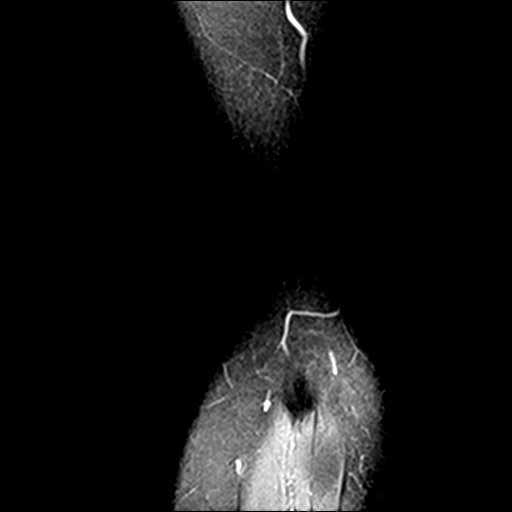
[im 5/27]
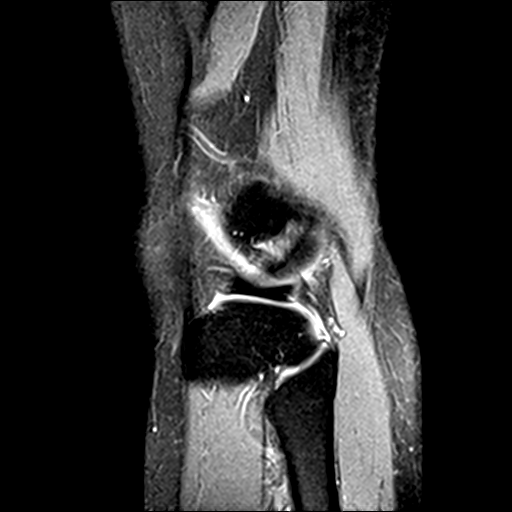
[im 9/27]
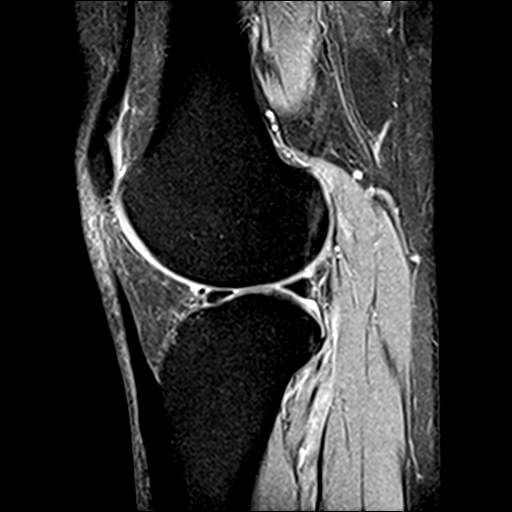
[im 14/27]
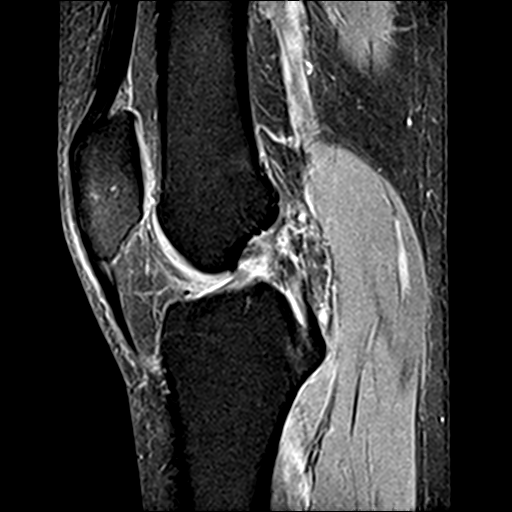
[im 18/27]
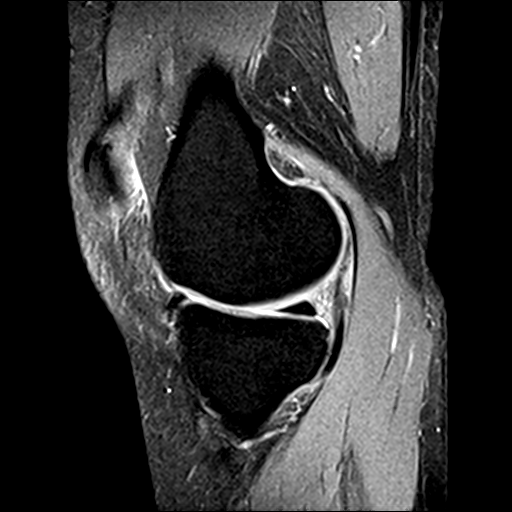
[im 22/27]
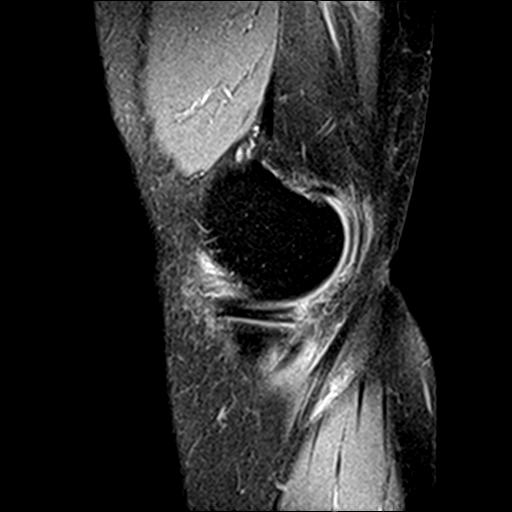
[im 27/27]
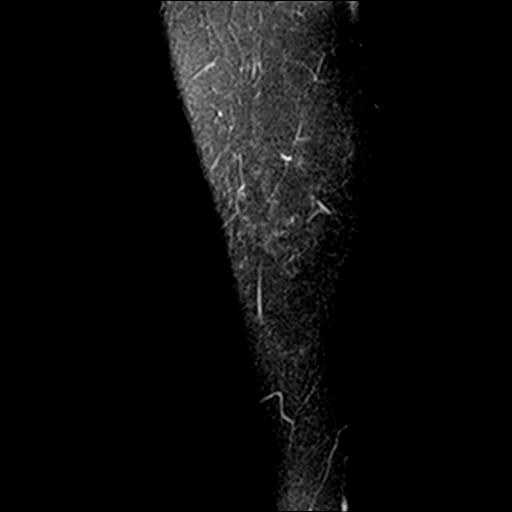

[Series 8: PD fat-sat · coronal · 4.0mm · 0.39mm/px · 6 of 22 slices shown (2 of 3)]
[im 1/22]
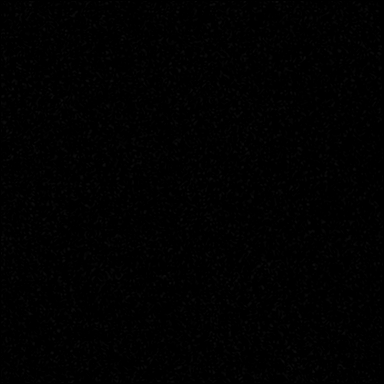
[im 5/22]
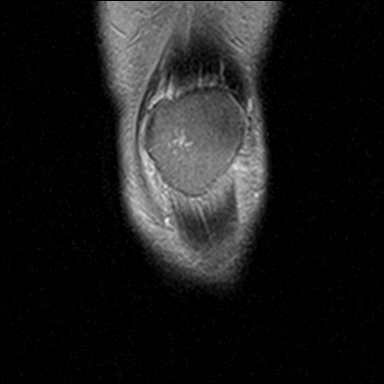
[im 9/22]
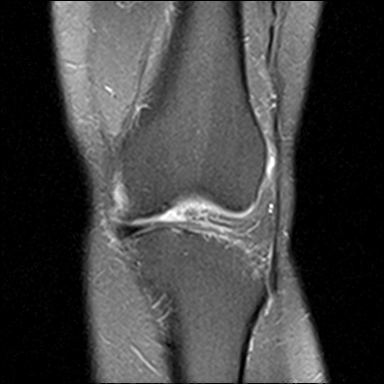
[im 13/22]
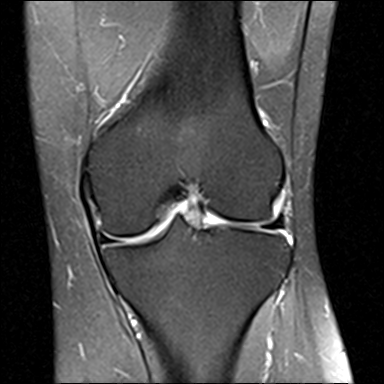
[im 17/22]
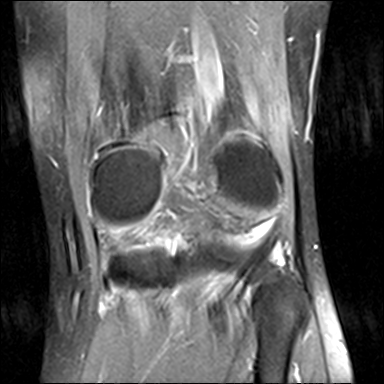
[im 22/22]
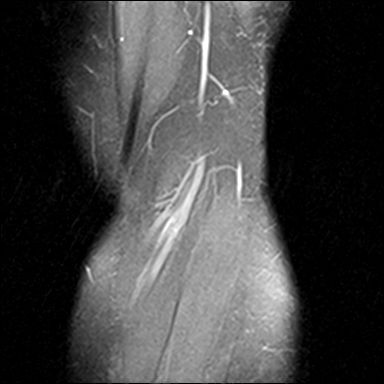

[Series 9: PD fat-sat · oblique · 2.0mm · 0.29mm/px · 3 of 11 slices shown (3 of 3)]
[im 1/11]
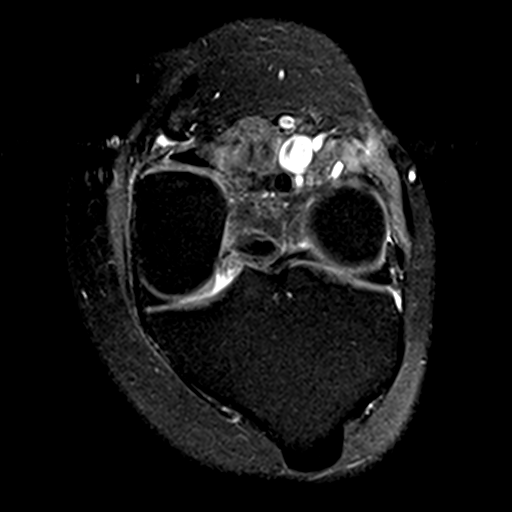
[im 6/11]
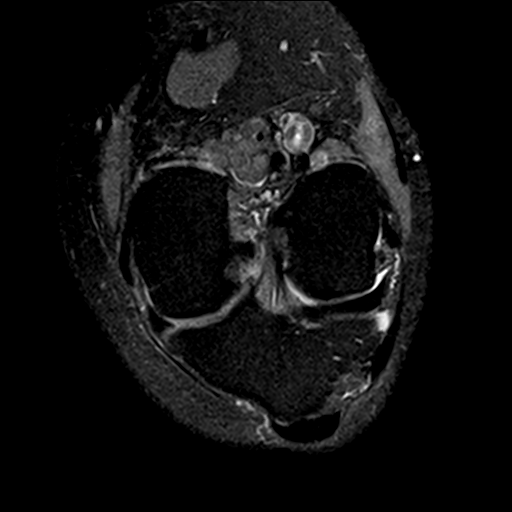
[im 11/11]
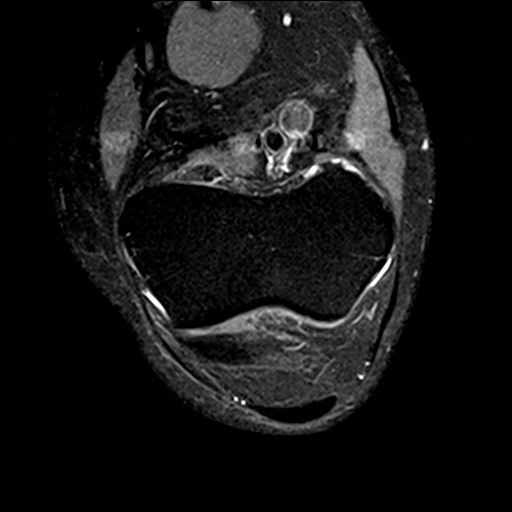

[25 of 40 positions shown; findings below may reference images not displayed]

FINDINGS: MENISCI

Medial meniscus:  Intact.

Lateral meniscus:  Intact.

LIGAMENTS

Cruciates:  Intact.

Collaterals:  Intact.

CARTILAGE

Patellofemoral:  Normal.

Medial:  Normal.

Lateral:  Normal.

Joint:  No effusion.

Popliteal Fossa:  Tiny Baker's cyst.

Extensor Mechanism:  Intact.

Bones:  Normal marrow signal throughout.

Other: None.
IMPRESSION: No evidence of trauma.  Negative for meniscal or ligament tear.

Very small Baker's cyst.

## 2021-03-18 ENCOUNTER — Other Ambulatory Visit: Payer: Self-pay | Admitting: Obstetrics & Gynecology

## 2021-03-18 DIAGNOSIS — Z1231 Encounter for screening mammogram for malignant neoplasm of breast: Secondary | ICD-10-CM

## 2021-04-03 ENCOUNTER — Inpatient Hospital Stay: Admission: RE | Admit: 2021-04-03 | Payer: 59 | Source: Ambulatory Visit

## 2021-05-13 ENCOUNTER — Ambulatory Visit: Payer: Self-pay

## 2021-05-13 ENCOUNTER — Ambulatory Visit
Admission: RE | Admit: 2021-05-13 | Discharge: 2021-05-13 | Disposition: A | Discharge status: Home / Self Care | Payer: Self-pay | Source: Ambulatory Visit | Attending: Obstetrics & Gynecology | Admitting: Obstetrics & Gynecology

## 2021-05-13 ENCOUNTER — Other Ambulatory Visit: Payer: Self-pay

## 2021-05-13 DIAGNOSIS — Z1231 Encounter for screening mammogram for malignant neoplasm of breast: Secondary | ICD-10-CM

## 2021-05-14 ENCOUNTER — Other Ambulatory Visit: Payer: Self-pay | Admitting: Obstetrics & Gynecology

## 2021-05-14 DIAGNOSIS — Z1231 Encounter for screening mammogram for malignant neoplasm of breast: Secondary | ICD-10-CM

## 2021-05-15 ENCOUNTER — Other Ambulatory Visit: Payer: Self-pay | Admitting: Obstetrics & Gynecology

## 2021-05-15 DIAGNOSIS — N644 Mastodynia: Secondary | ICD-10-CM

## 2021-05-15 DIAGNOSIS — Z1231 Encounter for screening mammogram for malignant neoplasm of breast: Secondary | ICD-10-CM

## 2021-06-02 ENCOUNTER — Ambulatory Visit: Payer: Self-pay

## 2021-06-02 ENCOUNTER — Ambulatory Visit
Admission: RE | Admit: 2021-06-02 | Discharge: 2021-06-02 | Disposition: A | Discharge status: Home / Self Care | Payer: Self-pay | Source: Ambulatory Visit | Attending: Obstetrics & Gynecology | Admitting: Obstetrics & Gynecology

## 2021-06-02 DIAGNOSIS — N644 Mastodynia: Secondary | ICD-10-CM

## 2021-06-16 ENCOUNTER — Ambulatory Visit: Payer: Self-pay

## 2021-08-13 ENCOUNTER — Ambulatory Visit: Payer: 59 | Admitting: Sports Medicine

## 2021-08-28 ENCOUNTER — Ambulatory Visit: Payer: 59 | Admitting: Sports Medicine

## 2021-09-18 ENCOUNTER — Ambulatory Visit (INDEPENDENT_AMBULATORY_CARE_PROVIDER_SITE_OTHER): Payer: 59

## 2021-09-18 ENCOUNTER — Ambulatory Visit (HOSPITAL_COMMUNITY)
Admission: EM | Admit: 2021-09-18 | Discharge: 2021-09-18 | Disposition: A | Discharge status: Home / Self Care | Payer: 59 | Attending: Family Medicine | Admitting: Family Medicine

## 2021-09-18 ENCOUNTER — Encounter (HOSPITAL_COMMUNITY): Payer: Self-pay

## 2021-09-18 DIAGNOSIS — M79644 Pain in right finger(s): Secondary | ICD-10-CM | POA: Diagnosis not present

## 2021-09-18 MED ORDER — PREDNISONE 20 MG PO TABS: 40.0000 mg | ORAL_TABLET | Freq: Every day | ORAL | 0 refills | Status: AC

## 2021-09-18 NOTE — Discharge Instructions (Signed)
Your x-ray did not show any bony abnormality. ? ?Take prednisone 20 mg--2 daily for 5 days. ?

## 2021-09-18 NOTE — ED Provider Notes (Signed)
?[facility] ? ? ? ?CSN: [id number] ?Arrival date & time: 09/18/21  1635 ? ? ?  ? ?History   ?Chief Complaint ?Chief Complaint  ?Patient presents with  ? Finger Injury  ? ? ?HPI ?Cathy Santana is a 56 y.o. female.  ? ?HPI ?Here for pain in her right ring finger.  About 3 weeks ago she was helping someone hang a curtain rod and pressed hard with her ring finger on a concrete wall.  Since then it started hurting and has been swollen.  Sometimes will get a little better and then gets worse.  Currently she can now bend it and flex it for she could not before.  It is still hurting and still painful ? ?Past Medical History:  ?Diagnosis Date  ? GI bleed   ? Hypercholesteremia   ? ? ?Patient Active Problem List  ? Diagnosis Date Noted  ? 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 HYPERLIPIDEMIA 11/21/2008  ? INSOMNIA, CHRONIC 11/21/2008  ? ? ?Past Surgical History:  ?Procedure Laterality Date  ? BUNIONECTOMY  2009  ? bilateral 4 weeks apart  ? CESAREAN SECTION    ? '96  ? TUBAL LIGATION    ? '08  ? ? ?OB History   ?No obstetric history on file. ?  ? ? ? ?Home Medications   ? ?Prior to Admission medications   ?Medication Sig Start Date End Date Taking? Authorizing Provider  ?predniSONE (DELTASONE) 20 MG tablet Take 2 tablets (40 mg total) by mouth daily with breakfast for 5 days. 09/18/21 09/23/21 Yes Zenia Resides, MD  ?hydrOXYzine (ATARAX/VISTARIL) 50 MG tablet Take 1 tablet (50 mg total) by mouth 3 (three) times daily as needed for anxiety. 03/21/20   Armandina Stammer I, NP  ?OLANZapine zydis (ZYPREXA) 15 MG disintegrating tablet Take 1 tablet (15 mg total) by mouth daily. For mood control 03/21/20   Armandina Stammer I, NP  ?OLANZapine zydis (ZYPREXA) 20 MG disintegrating tablet Take 1 tablet (20 mg total) by mouth at bedtime. For mood control 03/21/20   Armandina Stammer I, NP  ?traZODone (DESYREL) 100 MG tablet Take 1 tablet (100 mg total) by mouth at bedtime as needed for sleep. 03/21/20   Sanjuana Kava, NP   ? ? ?Family History ?Family History  ?Problem Relation Age of Onset  ? Healthy Mother   ? Stroke Father   ? Stroke Sister   ? Breast cancer Paternal Grandmother   ?     unsure of age  ? ? ?Social History ?Social History  ? ?Tobacco Use  ? Smoking status: Never  ? Smokeless tobacco: Never  ?Vaping Use  ? Vaping Use: Never used  ?Substance Use Topics  ? Alcohol use: No  ? Drug use: No  ? ? ? ?Allergies   ?Codeine and Nsaids ? ? ?Review of Systems ?Review of Systems ? ? ?Physical Exam ?Triage Vital Signs ?ED Triage Vitals  ?Enc Vitals Group  ?   BP 09/18/21 1659 122/78  ?   Pulse Rate 09/18/21 1659 60  ?   Resp 09/18/21 1659 19  ?   Temp 09/18/21 1659 98 ?F (36.7 ?C)  ?   Temp src --   ?   SpO2 09/18/21 1659 98 %  ?   Weight --   ?   Height --   ?   Head Circumference --   ?   Peak Flow --   ?   Pain Score 09/18/21 1658 5  ?  Pain Loc --   ?   Pain Edu? --   ?   Excl. in GC? --   ? ?No data found. ? ?Updated Vital Signs ?BP 122/78   Pulse 60   Temp 98 ?F (36.7 ?C)   Resp 19   LMP 05/05/2015   SpO2 98%  ? ?Visual Acuity ?Right Eye Distance:   ?Left Eye Distance:   ?Bilateral Distance:   ? ?Right Eye Near:   ?Left Eye Near:    ?Bilateral Near:    ? ?Physical Exam ?Vitals reviewed.  ?Constitutional:   ?   General: She is not in acute distress. ?   Appearance: She is not toxic-appearing.  ?Musculoskeletal:     ?   General: Tenderness (There is tenderness and swelling of the right ring finger; she is able to flex it just a little) present.  ?Neurological:  ?   Mental Status: She is oriented to person, place, and time.  ?Psychiatric:     ?   Behavior: Behavior normal.  ? ? ? ?UC Treatments / Results  ?Labs ?(all labs ordered are listed, but only abnormal results are displayed) ?Labs Reviewed - No data to display ? ?EKG ? ? ?Radiology ?DG Finger Ring Right ? ?Result Date: 09/18/2021 ?CLINICAL DATA:  Ring finger pain and swelling. EXAM: RIGHT RING FINGER 2+V COMPARISON:  None. FINDINGS: There is no evidence of fracture  or dislocation. There is no evidence of arthropathy or other focal bone abnormality. Soft tissues are unremarkable. IMPRESSION: Negative. Electronically Signed   By: Kennith Center M.D.   On: 09/18/2021 17:26   ? ?Procedures ?Procedures (including critical care time) ? ?Medications Ordered in UC ?Medications - No data to display ? ?Initial Impression / Assessment and Plan / UC Course  ?I have reviewed the triage vital signs and the nursing notes. ? ?Pertinent labs & imaging results that were available during my care of the patient were reviewed by me and considered in my medical decision making (see chart for details). ? ?  ? ?X-ray is negative.  She states ibuprofen causes ulcers.  She is amenable to trying a few days of prednisone ?Final Clinical Impressions(s) / UC Diagnoses  ? ?Final diagnoses:  ?Pain of finger of right hand  ? ? ? ?Discharge Instructions   ? ?  ?Your x-ray did not show any bony abnormality. ? ?Take prednisone 20 mg--2 daily for 5 days. ? ? ? ? ?ED Prescriptions   ? ? Medication Sig Dispense Auth. Provider  ? predniSONE (DELTASONE) 20 MG tablet Take 2 tablets (40 mg total) by mouth daily with breakfast for 5 days. 10 tablet Zenia Resides, MD  ? ?  ? ?PDMP not reviewed this encounter. ?  ?Zenia Resides, MD ?09/18/21 1735 ? ?

## 2021-09-18 NOTE — ED Triage Notes (Signed)
Pt presents with right ring finger pain and swelling. Reports x 3 weeks. States she was hanging a curtain rod and she had to use a lot of force, reports pain since incident. Reports relief with compression and ice. Pain and range of motion has improved some.  ?

## 2021-09-29 ENCOUNTER — Other Ambulatory Visit: Payer: Self-pay

## 2021-09-29 ENCOUNTER — Encounter: Payer: Self-pay | Admitting: Orthopedic Surgery

## 2021-09-29 ENCOUNTER — Ambulatory Visit: Payer: 59 | Admitting: Orthopedic Surgery

## 2021-09-29 DIAGNOSIS — S63639A Sprain of interphalangeal joint of unspecified finger, initial encounter: Secondary | ICD-10-CM | POA: Insufficient documentation

## 2021-09-29 NOTE — Progress Notes (Signed)
? ?Office Visit Note ?  ?Patient: Cathy Santana           ?Date of Birth: 29-Apr-1966           ?MRN: [id number] ?Visit Date: 09/29/2021 ?             ?Requested by: Mitchel Honour, DO ?[address], [address] ?[address], [address] ?[place],  [place] [address] ?PCP: Mitchel Honour, DO ? ? ?Assessment & Plan: ?Visit Diagnoses:  ?1. Sprain of proximal interphalangeal (PIP) joint of finger   ? ? ?Plan: Discussed with patient that her x-rays do not demonstrate any fracture and the PIP joint is concentrically reduced.  Discussed that the asymmetric swelling is likely the result of an injury to the collateral ligament at the PIP joint.  She does not need a brace at this point.  Discussed use of Coban wrapping to improve her minimal continued swelling.  I can see her back as needed. ? ?Follow-Up Instructions: No follow-ups on file.  ? ?Orders:  ?No orders of the defined types were placed in this encounter. ? ?No orders of the defined types were placed in this encounter. ? ? ? ? Procedures: ?No procedures performed ? ? ?Clinical Data: ?No additional findings. ? ? ?Subjective: ?Chief Complaint  ?Patient presents with  ? Right Hand - New Patient (Initial Visit)  ? ? ?This is a 56 year old left-hand-dominant female presents with an injury to the right ring finger proximally 4 weeks ago.  She was helping a client hang a curtain rod when she was forcibly holding this joint against a concrete wall.  She subsequently developed pain at the ring finger PIP joint.  She was seen in urgent care where she was started on a prednisone taper.  The prednisone taper was very effective in reducing the edema at the finger.  She still has mild pain.  She has near full range of motion at this point.  She has been in an aluminum finger splint. ? ? ?Review of Systems ? ? ?Objective: ?Vital Signs: BP 134/84 (BP Location: Left Arm, Patient Position: Sitting, Cuff Size: Normal)   Pulse 75   Ht 5\' 4"  (1.626 m)   Wt 153 lb (69.4 kg)   LMP  05/05/2015   SpO2 98%   BMI 26.26 kg/m?  ? ?Physical Exam ?Constitutional:   ?   Appearance: Normal appearance.  ?Cardiovascular:  ?   Rate and Rhythm: Normal rate.  ?   Pulses: Normal pulses.  ?Pulmonary:  ?   Effort: Pulmonary effort is normal.  ?Skin: ?   General: Skin is warm and dry.  ?   Capillary Refill: Capillary refill takes less than 2 seconds.  ?Neurological:  ?   Mental Status: She is alert.  ? ? ?Right Hand Exam  ? ?Tenderness  ?Right hand tenderness location: Mildly TTP at radial and ulnar aspects of ring finger PIP joint w/ minimal swelling. ? ?Other  ?Erythema: absent ?Sensation: normal ?Pulse: present ? ?Comments:  Ring finger ROM 0-90 at MP, 0-105 at PIP, and 0-45 at DIP.  Minimal swelling at PIP joint.  No PIP instability in extension or flexion.  ? ? ? ? ?Specialty Comments:  ?No specialty comments available. ? ?Imaging: ?No results found. ? ? ?PMFS History: ?Patient Active Problem List  ? Diagnosis Date Noted  ? Sprain of proximal interphalangeal (PIP) joint of finger 09/29/2021  ? 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 HYPERLIPIDEMIA 11/21/2008  ? INSOMNIA, CHRONIC 11/21/2008  ? ?  Past Medical History:  ?Diagnosis Date  ? GI bleed   ? Hypercholesteremia   ?  ?Family History  ?Problem Relation Age of Onset  ? Healthy Mother   ? Stroke Father   ? Stroke Sister   ? Breast cancer Paternal Grandmother   ?     unsure of age  ?  ?Past Surgical History:  ?Procedure Laterality Date  ? BUNIONECTOMY  2009  ? bilateral 4 weeks apart  ? CESAREAN SECTION    ? '96  ? TUBAL LIGATION    ? '08  ? ?Social History  ? ?Occupational History  ? Not on file  ?Tobacco Use  ? Smoking status: Never  ? Smokeless tobacco: Never  ?Vaping Use  ? Vaping Use: Never used  ?Substance and Sexual Activity  ? Alcohol use: No  ? Drug use: No  ? Sexual activity: Not on file  ? ? ? ? ? ? ?

## 2021-10-30 ENCOUNTER — Ambulatory Visit: Payer: 59 | Admitting: Orthopedic Surgery

## 2021-11-03 ENCOUNTER — Ambulatory Visit: Payer: 59 | Admitting: Orthopedic Surgery

## 2021-11-10 ENCOUNTER — Ambulatory Visit: Payer: 59 | Admitting: Orthopedic Surgery

## 2021-11-10 DIAGNOSIS — S63634A Sprain of interphalangeal joint of right ring finger, initial encounter: Secondary | ICD-10-CM | POA: Diagnosis not present

## 2021-11-10 DIAGNOSIS — S63639A Sprain of interphalangeal joint of unspecified finger, initial encounter: Secondary | ICD-10-CM

## 2021-11-10 NOTE — Progress Notes (Signed)
? ?Office Visit Note ?  ?Patient: Cathy Santana           ?Date of Birth: 02-Jan-1966           ?MRN: [id number] ?Visit Date: 11/10/2021 ?             ?Requested by: Linda Hedges, DO ?[address] [place], [address] ?[place],  [place] [address] ?PCP: Linda Hedges, DO ? ? ?Assessment & Plan: ?Visit Diagnoses:  ?1. Sprain of proximal interphalangeal (PIP) joint of finger   ? ? ?Plan: Patient presents today because she is concerned about the appearance of her right ring finger at the PIP joint.  She injured this finger approximately 3 months ago.  She is concerned that the finger is crooked.  On exam, it looks like she has swelling of the radial collateral ligament which corresponds to her symptomatic area.  She has no collateral instability. We performed a live fluroscopic exam of this finger to reassure the patient that there was no fracture and that the joint is concentrically reduced and straight.  We discussed the PIP joint injuries take some time for the swelling to resolve.  She has full range of motion with no stiffness.  She can follow-up again with me as needed. ? ?Follow-Up Instructions: No follow-ups on file.  ? ?Orders:  ?No orders of the defined types were placed in this encounter. ? ?No orders of the defined types were placed in this encounter. ? ? ? ? Procedures: ?No procedures performed ? ? ?Clinical Data: ?No additional findings. ? ? ?Subjective: ?Chief Complaint  ?Patient presents with  ? Right Ring Finger - Follow-up  ? ? ?This is a 56 year old left-hand-dominant female presents for follow-up of a right ring finger PIP joint injury.  She is now approximately 3 months out from injury.  Her concern is that the finger appears to be crooked to her.  It does appear that she is swollen on the radial side suggesting a radial collateral ligament injury.  She denies any subjective instability of the finger.  She has full range of motion at the joint.  Her finger is still mildly  swollen. ? ? ?Review of Systems ? ? ?Objective: ?Vital Signs: LMP 05/05/2015  ? ?Physical Exam ? ?Right Hand Exam  ? ?Tenderness  ?Right hand tenderness location: Mildly TTP at radial aspect of PIP joint. ? ?Other  ?Erythema: absent ?Sensation: normal ?Pulse: present ? ?Comments:  Full ROM of ring finger including PIP joint.  Mild joint swelling more pronounced on radial side.  No collateral ligament instability.  ? ? ? ? ?Specialty Comments:  ?No specialty comments available. ? ?Imaging: ?No results found. ? ? ?PMFS History: ?Patient Active Problem List  ? Diagnosis Date Noted  ? Sprain of proximal interphalangeal (PIP) joint of finger 09/29/2021  ? Brief psychotic disorder ([place]) 03/19/2020  ? HYPERLIPIDEMIA 11/21/2008  ? INSOMNIA, CHRONIC 11/21/2008  ? ?Past Medical History:  ?Diagnosis Date  ? GI bleed   ? Hypercholesteremia   ?  ?Family History  ?Problem Relation Age of Onset  ? Healthy Mother   ? Stroke Father   ? Stroke Sister   ? Breast cancer Paternal Grandmother   ?     unsure of age  ?  ?Past Surgical History:  ?Procedure Laterality Date  ? BUNIONECTOMY  2009  ? bilateral 4 weeks apart  ? CESAREAN SECTION    ? '96  ? TUBAL LIGATION    ? '08  ? ?  Social History  ? ?Occupational History  ? Not on file  ?Tobacco Use  ? Smoking status: Never  ? Smokeless tobacco: Never  ?Vaping Use  ? Vaping Use: Never used  ?Substance and Sexual Activity  ? Alcohol use: No  ? Drug use: No  ? Sexual activity: Not on file  ? ? ? ? ? ? ?

## 2021-11-12 ENCOUNTER — Encounter: Payer: Self-pay | Admitting: Orthopaedic Surgery

## 2021-11-12 ENCOUNTER — Ambulatory Visit (INDEPENDENT_AMBULATORY_CARE_PROVIDER_SITE_OTHER): Payer: 59

## 2021-11-12 ENCOUNTER — Ambulatory Visit: Payer: 59 | Admitting: Orthopaedic Surgery

## 2021-11-12 DIAGNOSIS — M25561 Pain in right knee: Secondary | ICD-10-CM

## 2021-11-12 MED ORDER — GABAPENTIN 100 MG PO CAPS: 100.0000 mg | ORAL_CAPSULE | Freq: Three times a day (TID) | ORAL | 1 refills | Status: AC | PRN

## 2021-11-12 MED ORDER — LIDOCAINE HCL 1 % IJ SOLN
3.0000 mL | INTRAMUSCULAR | Status: AC | PRN
  Administered 2021-11-12: 3 mL

## 2021-11-12 MED ORDER — METHYLPREDNISOLONE ACETATE 40 MG/ML IJ SUSP
40.0000 mg | INTRAMUSCULAR | Status: AC | PRN
  Administered 2021-11-12: 40 mg via INTRA_ARTICULAR

## 2021-11-12 NOTE — Progress Notes (Signed)
? ?Office Visit Note ?  ?Patient: Cathy Santana           ?Date of Birth: 03/10/1966           ?MRN: [id number] ?Visit Date: 11/12/2021 ?             ?Requested by: Mitchel Honour, DO ?[address], [address] ?[address] ?[place],  [place] [address] ?PCP: Mitchel Honour, DO ? ? ?Assessment & Plan: ?Visit Diagnoses:  ?1. Acute pain of right knee   ? ? ?Plan: I did recommend at least some more conservative treatment for her right knee and recommended steroid injection.  She agreed to this treatment plan and tolerated it well.  She is wanting the medication for the burning pain so I will put her on Neurontin 100 mg to take up to 3 times a day as needed.  We will see her back in 2 weeks to see what her response has been to the steroid injection.  If she does not have significant relief, our next step will be considering an MRI of her right knee to rule out any type of fracture that is not being seen or tear of ligaments or tendons. ? ?Follow-Up Instructions: Return in about 2 weeks (around 11/26/2021).  ? ?Orders:  ?Orders Placed This Encounter  ?Procedures  ? Large Joint Inj  ? XR Knee 1-2 Views Right  ? ?Meds ordered this encounter  ?Medications  ? gabapentin (NEURONTIN) 100 MG capsule  ?  Sig: Take 1 capsule (100 mg total) by mouth 3 (three) times daily as needed.  ?  Dispense:  60 capsule  ?  Refill:  1  ? ? ? ? Procedures: ?Large Joint Inj: R knee on 11/12/2021 1:58 PM ?Indications: diagnostic evaluation and pain ?Details: 22 G 1.5 in needle, superolateral approach ? ?Arthrogram: No ? ?Medications: 3 mL lidocaine 1 %; 40 mg methylPREDNISolone acetate 40 MG/ML ?Outcome: tolerated well, no immediate complications ?Procedure, treatment alternatives, risks and benefits explained, specific risks discussed. Consent was given by the patient. Immediately prior to procedure a time out was called to verify the correct patient, procedure, equipment, support staff and site/side marked as required. Patient was  prepped and draped in the usual sterile fashion.  ? ? ? ? ?Clinical Data: ?No additional findings. ? ? ?Subjective: ?Chief Complaint  ?Patient presents with  ? Right Knee - Pain  ?The patient comes in today after a fall about a month ago at work injuring her right knee.  She was seen in urgent care center and was told she needed an MRI of the right knee.  She does report a history of an arthroscopic intervention to the right knee in 2010 where they found a significant meniscal tear.  She says the knee has been swelling and hurting and has a burning sensation inside her knee joint.  She walks with a limp.  She has been wearing a knee brace.  She cannot take anti-inflammatories due to other medical issues.  She is a thin individual.  She denies being a diabetic. ? ?HPI ? ?Review of Systems ?There is currently no fever, chills, nausea, vomiting she is alert and orient x3 and in no acute distress.  She is walking with a limp ? ?Objective: ?Vital Signs: LMP 05/05/2015  ? ?Physical Exam ? ?Ortho Exam ?Examination of her right knee shows no effusion.  She has a negative Lachman's exam.*To get a McMurray exam out of her knee due to the  pain she is having.  There is a healing eschar just distal to the lateral knee joint from her fall.  Range of motion is full but painful. ?Specialty Comments:  ?No specialty comments available. ? ?Imaging: ?XR Knee 1-2 Views Right ? ?Result Date: 11/12/2021 ?2 views of the right knee show no obvious fracture or periosteal reaction.  There is no effusion.  The alignment is well-maintained.  There is no acute findings.  ? ? ?PMFS History: ?Patient Active Problem List  ? Diagnosis Date Noted  ? Sprain of proximal interphalangeal (PIP) joint of finger 09/29/2021  ? 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 HYPERLIPIDEMIA 11/21/2008  ? INSOMNIA, CHRONIC 11/21/2008  ? ?Past Medical History:  ?Diagnosis Date  ? GI bleed   ? Hypercholesteremia   ?  ?Family History  ?Problem Relation Age of Onset  ?  Healthy Mother   ? Stroke Father   ? Stroke Sister   ? Breast cancer Paternal Grandmother   ?     unsure of age  ?  ?Past Surgical History:  ?Procedure Laterality Date  ? BUNIONECTOMY  2009  ? bilateral 4 weeks apart  ? CESAREAN SECTION    ? '96  ? TUBAL LIGATION    ? '08  ? ?Social History  ? ?Occupational History  ? Not on file  ?Tobacco Use  ? Smoking status: Never  ? Smokeless tobacco: Never  ?Vaping Use  ? Vaping Use: Never used  ?Substance and Sexual Activity  ? Alcohol use: No  ? Drug use: No  ? Sexual activity: Not on file  ? ? ? ? ? ? ?

## 2021-12-02 ENCOUNTER — Ambulatory Visit: Payer: 59 | Admitting: Orthopaedic Surgery

## 2022-07-06 ENCOUNTER — Ambulatory Visit
Admission: RE | Admit: 2022-07-06 | Discharge: 2022-07-06 | Disposition: A | Discharge status: Home / Self Care | Payer: Managed Care, Other (non HMO) | Source: Ambulatory Visit | Attending: Obstetrics & Gynecology | Admitting: Obstetrics & Gynecology

## 2022-07-06 ENCOUNTER — Other Ambulatory Visit: Payer: Self-pay | Admitting: Obstetrics & Gynecology

## 2022-07-06 DIAGNOSIS — Z1231 Encounter for screening mammogram for malignant neoplasm of breast: Secondary | ICD-10-CM

## 2023-04-14 ENCOUNTER — Encounter: Payer: Self-pay | Admitting: Obstetrics & Gynecology

## 2023-06-06 ENCOUNTER — Other Ambulatory Visit: Payer: Self-pay | Admitting: Obstetrics & Gynecology

## 2023-06-06 DIAGNOSIS — Z1231 Encounter for screening mammogram for malignant neoplasm of breast: Secondary | ICD-10-CM

## 2023-07-11 ENCOUNTER — Ambulatory Visit: Payer: Managed Care, Other (non HMO)

## 2023-07-12 ENCOUNTER — Ambulatory Visit
Admission: EM | Admit: 2023-07-12 | Discharge: 2023-07-12 | Disposition: A | Discharge status: Home / Self Care | Payer: No Typology Code available for payment source | Attending: Family Medicine | Admitting: Family Medicine

## 2023-07-12 ENCOUNTER — Telehealth: Payer: Self-pay | Admitting: Obstetrics & Gynecology

## 2023-07-12 ENCOUNTER — Ambulatory Visit (INDEPENDENT_AMBULATORY_CARE_PROVIDER_SITE_OTHER): Payer: No Typology Code available for payment source

## 2023-07-12 ENCOUNTER — Ambulatory Visit: Payer: Self-pay | Admitting: *Deleted

## 2023-07-12 ENCOUNTER — Ambulatory Visit: Payer: Self-pay

## 2023-07-12 DIAGNOSIS — M25521 Pain in right elbow: Secondary | ICD-10-CM

## 2023-07-12 MED ORDER — PREDNISONE 20 MG PO TABS: 20.0000 mg | ORAL_TABLET | Freq: Every day | ORAL | 0 refills | Status: AC

## 2023-07-12 NOTE — ED Triage Notes (Signed)
 Sx x 1-2 weeks  Right arm and elbow pain.

## 2023-07-12 NOTE — ED Provider Notes (Signed)
 [facility]    CSN: [id number] Arrival date & time: 07/12/23  1106      History   Chief Complaint Chief Complaint  Patient presents with   Joint Swelling    HPI Cathy Santana is a 58 y.o. female.   HPI Patient presents today for evaluation of right elbow pain ongoing for 1 and half weeks.  She reports the pain occurs in different parts of her elbow.  She also reports having a bruise lateral aspect of her elbow is unaware of how the bruise got there.  She denies any known injury that could have caused pain to the elbow.  Taken Tylenol  without relief of pain. 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      Home Medications    Prior to Admission medications   Medication Sig Start Date End Date Taking? Authorizing Provider  predniSONE  (DELTASONE ) 20 MG tablet Take 1 tablet (20 mg total) by mouth daily with breakfast for 5 days. 07/12/23 07/17/23 Yes Arloa Suzen RAMAN, NP  gabapentin  (NEURONTIN ) 100 MG capsule Take 1 capsule (100 mg total) by mouth 3 (three) times daily as needed. 11/12/21   Vernetta Lonni GRADE, MD  hydrOXYzine  (ATARAX /VISTARIL ) 50 MG tablet Take 1 tablet (50 mg total) by mouth 3 (three) times daily as needed for anxiety. 03/21/20   Collene Gouge I, NP  OLANZapine  zydis (ZYPREXA ) 15 MG disintegrating tablet Take 1 tablet (15 mg total) by mouth daily. For mood control 03/21/20   Collene Gouge I, NP  OLANZapine  zydis (ZYPREXA ) 20 MG disintegrating tablet Take 1 tablet (20 mg total) by mouth  at bedtime. For mood control 03/21/20   Collene Gouge I, NP  traZODone  (DESYREL ) 100 MG tablet Take 1 tablet (100 mg total) by mouth at bedtime as needed for sleep. 03/21/20   Collene Gouge FERNS, NP    Family History Family History  Problem Relation Age of Onset   Healthy Mother    Stroke Father    Stroke Sister    Breast cancer Paternal Grandmother        unsure of age    Social History Social History   Tobacco Use   Smoking status: Never   Smokeless tobacco: Never  Vaping Use   Vaping status: Never Used  Substance Use Topics   Alcohol use: No   Drug use: No     Allergies   Codeine and Nsaids   Review of Systems Review of Systems   Physical Exam Triage Vital Signs ED Triage Vitals  Encounter Vitals Group     BP 07/12/23 1230 (!) 150/79     Systolic BP Percentile --      Diastolic BP Percentile --      Pulse Rate 07/12/23 1230 (!) 56     Resp 07/12/23 1230 19     Temp 07/12/23 1230 98.1 F (36.7 C)     Temp Source 07/12/23 1230 Oral  SpO2 07/12/23 1230 99 %     Weight --      Height --      Head Circumference --      Peak Flow --      Pain Score 07/12/23 1229 6     Pain Loc --      Pain Education --      Exclude from Growth Chart --    No data found.  Updated Vital Signs BP (!) 150/79 (BP Location: Left Arm)   Pulse (!) 56   Temp 98.1 F (36.7 C) (Oral)   Resp 19   LMP 05/05/2015   SpO2 99%   Visual Acuity Right Eye Distance:   Left Eye Distance:   Bilateral Distance:    Right Eye Near:   Left Eye Near:    Bilateral Near:     Physical Exam Vitals reviewed.  Constitutional:      Appearance: Normal appearance.  HENT:     Head: Normocephalic and atraumatic.  Eyes:     Extraocular Movements: Extraocular movements intact.     Pupils: Pupils are equal, round, and reactive to light.  Cardiovascular:     Rate and Rhythm: Normal rate and regular rhythm.  Pulmonary:     Effort: Pulmonary effort is normal.     Breath sounds: Normal breath  sounds.  Musculoskeletal:        General: No swelling, deformity or signs of injury.     Right elbow: Normal.     Left elbow: Normal.     Cervical back: Normal range of motion and neck supple.     Comments: Bruise on the lateral aspect of right elbow  Neurological:     General: No focal deficit present.     Mental Status: She is alert.      UC Treatments / Results  Labs (all labs ordered are listed, but only abnormal results are displayed) Labs Reviewed - No data to display  EKG   Radiology DG Elbow 2 Views Right Result Date: 07/12/2023 CLINICAL DATA:  Right elbow pain. EXAM: RIGHT ELBOW - 2 VIEW COMPARISON:  None Available. FINDINGS: There is no evidence of fracture, dislocation, or joint effusion. There is no evidence of arthropathy or other focal bone abnormality. Soft tissues are unremarkable. IMPRESSION: Negative. Electronically Signed   By: Vanetta Chou M.D.   On: 07/12/2023 13:31    Procedures Procedures (including critical care time)  Medications Ordered in UC Medications - No data to display  Initial Impression / Assessment and Plan / UC Course  I have reviewed the triage vital signs and the nursing notes.  Pertinent labs & imaging results that were available during my care of the patient were reviewed by me and considered in my medical decision making (see chart for details).    Patient was discharged prior to imaging results being reviewed. Patient called and updated that imaging of the right elbow are all within normal parameters. Patient endorses ongoing pain therefore prescribed a low-dose of prednisone  20 mg daily for 5 days as patient is intolerant of NSAIDs.  Encouraged to continue to apply heat to the elbow joint he likely has suffered a strain injury.  Follow-up with PCP as needed. Final Clinical Impressions(s) / UC Diagnoses   Final diagnoses:  Right elbow pain     Discharge Instructions      Will call with results.     ED Prescriptions      Medication Sig Dispense Auth. Provider   predniSONE  (  DELTASONE ) 20 MG tablet Take 1 tablet (20 mg total) by mouth daily with breakfast for 5 days. 5 tablet Arloa Suzen RAMAN, NP      PDMP not reviewed this encounter.   Arloa Suzen RAMAN, NP 07/12/23 [phone]

## 2023-07-12 NOTE — Telephone Encounter (Signed)
 Message from Cathy Marda Blow sent at 07/12/2023  9:43 AM EST  Summary: right arm pain , swollen   Right arm, swollen and bruising. Pain off and on. Right now its about 7.   Going on about a week and a half...          Call History  Contact Date/Time Type Contact Phone/Fax By  07/12/2023 09:39 AM EST Phone (Incoming) Cathy Santana, Cathy Santana (Self) [phone] (M) Cathy Santana   Reason for Disposition  [1] MODERATE pain (Santana.g., interferes with normal activities) AND [2] present > 3 days    Referred to urgent care since not established with Primary Care at [facility] yet.  Answer Assessment - Initial Assessment Questions 1. ONSET: When did the pain start?     I'm having right arm swelling, pain and bruising.   I'm have a new pt appt scheduled for Primary Care at [facility] for March 2015 with Dr. Tanda. 2. LOCATION: Where is the pain located?     My right arm is swollen and painful for a week and a half.   Nothing is helping it.   There's also bruising present.   Denies accidents or injuries.  3. PAIN: How bad is the pain? (Scale 1-10; or mild, moderate, severe)   - MILD (1-3): Doesn't interfere with normal activities.   - MODERATE (4-7): Interferes with normal activities (Santana.g., work or school) or awakens from sleep.   - SEVERE (8-10): Excruciating pain, unable to do any normal activities, unable to hold a cup of water.     Moderate pain.   Nothing is helping, medication or heat or ice. 4. WORK OR EXERCISE: Has there been any recent work or exercise that involved this part of the body?     No 5. CAUSE: What do you think is causing the arm pain?     I don't know 6. OTHER SYMPTOMS: Do you have any other symptoms? (Santana.g., neck pain, swelling, rash, fever, numbness, weakness)     Above 7. PREGNANCY: Is there any chance you are pregnant? When was your last menstrual period?     Not asked due to age  Protocols used: Arm Pain-A-AH

## 2023-07-12 NOTE — Telephone Encounter (Signed)
  Chief Complaint: Right arm swelling, pain and bruising for the last week and a half.   Has new pt appt with Primary Care at [facility] In March 2025 with Dr. Raguel Blush. Symptoms: above Frequency: Last week and a half Pertinent Negatives: Patient denies accidents or injuries Disposition: [] ED /[x] Urgent Care (no appt availability in office) / [] Appointment(In office/virtual)/ []  [facility]/ [] Home Care/ [] Refused Recommended Disposition /[] [facility] [facility]/ []  Follow-up with PCP Additional Notes: Referred to urgent care since not established with Primary Care at [facility] yet.

## 2023-07-12 NOTE — Telephone Encounter (Signed)
 Copied from CRM [id number]. Topic: General - Inquiry >> Jul 12, 2023  9:55 AM Edsel HERO wrote: Patient called to see where she could get a mammogram that would be covered by her insurance. Patient states she went to [facility] yesterday that they do not take her insurance.

## 2023-07-12 NOTE — Discharge Instructions (Addendum)
 Will call with results

## 2023-07-14 NOTE — Telephone Encounter (Signed)
 I called patient and no one answered so I left a message on the answering machine to return call.

## 2023-07-14 NOTE — Telephone Encounter (Signed)
 Patient returned called and made me aware that she has already taking care of her mammogram appointment.

## 2023-09-20 ENCOUNTER — Ambulatory Visit: Payer: Commercial Managed Care - HMO | Admitting: Family Medicine

## 2023-10-04 ENCOUNTER — Ambulatory Visit (HOSPITAL_COMMUNITY)
